# Patient Record
Sex: Female | Born: 1970 | Race: White | Hispanic: No | Marital: Married | Smoking: Never smoker
Health system: Southern US, Community
[De-identification: ages and names within clinical notes are randomized; demographics above are authoritative.]

## PROBLEM LIST (undated history)

## (undated) DIAGNOSIS — F32A Depression, unspecified: Secondary | ICD-10-CM

## (undated) DIAGNOSIS — F329 Major depressive disorder, single episode, unspecified: Secondary | ICD-10-CM

## (undated) DIAGNOSIS — F419 Anxiety disorder, unspecified: Secondary | ICD-10-CM

## (undated) DIAGNOSIS — G8929 Other chronic pain: Secondary | ICD-10-CM

## (undated) DIAGNOSIS — M25512 Pain in left shoulder: Principal | ICD-10-CM

## (undated) HISTORY — DX: Major depressive disorder, single episode, unspecified: F32.9

## (undated) HISTORY — DX: Depression, unspecified: F32.A

## (undated) HISTORY — DX: Anxiety disorder, unspecified: F41.9

## (undated) HISTORY — DX: Other chronic pain: G89.29

## (undated) HISTORY — DX: Pain in left shoulder: M25.512

---

## 2007-06-14 ENCOUNTER — Encounter: Admission: RE | Admit: 2007-06-14 | Discharge: 2007-06-14 | Payer: Self-pay | Admitting: Obstetrics and Gynecology

## 2007-09-28 ENCOUNTER — Ambulatory Visit (HOSPITAL_COMMUNITY): Admission: RE | Admit: 2007-09-28 | Discharge: 2007-09-28 | Payer: Self-pay | Admitting: Obstetrics and Gynecology

## 2008-05-31 ENCOUNTER — Inpatient Hospital Stay (HOSPITAL_COMMUNITY): Admission: AD | Admit: 2008-05-31 | Discharge: 2008-05-31 | Payer: Self-pay | Admitting: Obstetrics & Gynecology

## 2008-08-13 ENCOUNTER — Inpatient Hospital Stay (HOSPITAL_COMMUNITY): Admission: AD | Admit: 2008-08-13 | Discharge: 2008-08-13 | Payer: Self-pay | Admitting: Obstetrics & Gynecology

## 2008-08-25 ENCOUNTER — Inpatient Hospital Stay (HOSPITAL_COMMUNITY): Admission: AD | Admit: 2008-08-25 | Discharge: 2008-08-25 | Payer: Self-pay | Admitting: Obstetrics and Gynecology

## 2008-09-11 ENCOUNTER — Inpatient Hospital Stay (HOSPITAL_COMMUNITY): Admission: RE | Admit: 2008-09-11 | Discharge: 2008-09-14 | Payer: Self-pay | Admitting: Obstetrics and Gynecology

## 2008-09-16 ENCOUNTER — Inpatient Hospital Stay (HOSPITAL_COMMUNITY): Admission: AD | Admit: 2008-09-16 | Discharge: 2008-09-16 | Payer: Self-pay | Admitting: Obstetrics and Gynecology

## 2010-09-18 LAB — CBC
Hemoglobin: 10.4 g/dL — ABNORMAL LOW (ref 12.0–15.0)
Hemoglobin: 13 g/dL (ref 12.0–15.0)
MCHC: 34.5 g/dL (ref 30.0–36.0)
MCHC: 34.8 g/dL (ref 30.0–36.0)
RBC: 3.14 MIL/uL — ABNORMAL LOW (ref 3.87–5.11)
RDW: 13.6 % (ref 11.5–15.5)
WBC: 6.6 10*3/uL (ref 4.0–10.5)

## 2010-09-18 LAB — RPR: RPR Ser Ql: NONREACTIVE

## 2010-10-22 NOTE — H&P (Signed)
Shari Peters, AFONSO               ACCOUNT NO.:  [id number]   MEDICAL RECORD NO.:  [id number]          PATIENT TYPE:  INP   LOCATION:                                FACILITY:  [facility]   PHYSICIAN:  Freddy Finner, M.D.   DATE OF BIRTH:  Nov 20, 1970   DATE OF ADMISSION:  09/11/2008  DATE OF DISCHARGE:                              HISTORY & PHYSICAL   ADMITTING DIAGNOSES:  Intrauterine pregnancy at term, surgically scarred  uterus, for repeat cesarean delivery.   The patient is a 40 year old white married female gravida 2, para 1 who  had a cesarean section with her first delivery for cephalopelvic  disproportion.  Her prenatal course has been remarkable for early  ultrasound finding of previa but this resolved.  She does have a history  of congestive heart failure with previous pregnancy, which is somewhat  questionable and not documented in our record.  She has had a cardiology  consultation prior to this pregnancy which was normal.  This pregnancy  has been uncomplicated except as noted above and she is now admitted at  39-1/7 weeks' gestation for repeat cesarean delivery.   REVIEW OF SYSTEMS:  Negative.  No cardiopulmonary, GI, or GU complaints  at the present time.   PAST MEDICAL HISTORY:  Recorded in detail in the prenatal summary, will  not be repeated here.   FAMILY HISTORY:  Noncontributory.   PHYSICAL EXAMINATION:  HEENT:  Grossly normal, thyroid gland is not  palpably enlarged.  Blood pressure in the office at her last visit was  118/70.  CHEST:  Clear to auscultation.  HEART:  Normal sinus rhythm, no significant murmur, no rebound, no  guarding.  ABDOMEN:  Gravid.  Estimated fetal weight of 8 pounds.  EXTREMITIES:  +1 edema without cyanosis or clubbing.  PELVIC EXAM:  Deferred.   ASSESSMENT:  Intrauterine pregnancy at term, surgically scarred uterus.   PLAN:  Repeat cesarean delivery.      Freddy Finner, M.D.  Electronically Signed     WRN/MEDQ  D:   09/09/2008  T:  09/09/2008  Job:  [id number]

## 2010-10-22 NOTE — Op Note (Signed)
NAMEPALAK, Shari Peters             ACCOUNT NO.:  [id number]   MEDICAL RECORD NO.:  [id number]          PATIENT TYPE:  INP   LOCATION:  9122                          FACILITY:  [facility]   PHYSICIAN:  Freddy Finner, M.D.   DATE OF BIRTH:  1970-12-08   DATE OF PROCEDURE:  DATE OF DISCHARGE:  08/25/2008                               OPERATIVE REPORT   PREOPERATIVE DIAGNOSIS:  Intrauterine pregnancy 39-1/[redacted] weeks gestation,  surgically scarred uterus.   POSTOPERATIVE DIAGNOSIS:  Intrauterine pregnancy 39-1/[redacted] weeks gestation,  surgically scarred uterus.   OPERATIVE PROCEDURE:  Repeat low transverse cervical cesarean section  with midline extension of uterine incision and delivery of viable female  infant, Apgars of 9 at 1 minute and 9 at 5 minute.  Arterial cord pH of  7.34.   INTRAOPERATIVE COMPLICATIONS:  None.   ESTIMATED INTRAOPERATIVE BLOOD LOSS:  800 cc.   The patient is a 40 year old gravida 3, para 1 by cesarean for CPD who  is now admitted at term for repeat cesarean delivery.  She was admitted  on the morning of surgery.  She was given cefotetan IV preoperatively.  She was brought to the operating room, placed under adequate spinal  anesthesia, placed in the dorsal recumbent position with elevation of  the right hip by 15 degrees.  The abdomen was prepped and draped in the  usual fashion and Foley catheter placed using sterile technique.   A lower abdominal transverse incision was made just above the old lower  abdominal transverse scar.  This was carried sharply down to fascia.  Fascia was entered sharply and extended to the extent of the skin  incision.  Rectus sheath was developed superiorly and inferiorly with  blunt and sharp dissection.  Rectus muscles divided in the midline.  Peritoneum was elevated, entered sharply and extended bluntly and  sharply to the extent of the skin incision.  Bladder blade was placed.  Transverse incision was made in the visceral peritoneum  overlying the  lower uterine segment and the bladder pushed off the lower segment.  A  transverse incision was made in the uterus and extended transversely  with blunt dissection to the extent of the skin incision.  The amniotic  membrane was ruptured.  Fluid was clear.  Initial attempts to deliver  the infant were complicated by scarring by the floating position of the  infant on start of the procedure.  A Kiwi device was applied, and the  incision was extended in the midline for a distance of approximately 3  cm.  This allowed easy delivery of a viable female infant.  Statistics are  noted above.  The placenta and products of conception were removed.  This was confirmed complete by manual exploration of the uterus.  The  uterus was delivered onto the anterior abdominal wall.  Careful manual  exploration confirmed complete evacuation of products of conception.  The uterine incision was closed with a single layer for the T portion of  the incision with running locking 0 Monocryl.  The transverse portion  was a double layered closure with running locking 0 Monocryl for  the  first layer and imbricating suture of 0 Monocryl for the second layer.  The uterus was delivered back into the abdominal cavity.  The uterus  itself was normal as were the tubes and ovaries.  Irrigation was carried  out.  Hemostasis was complete.  All pack, needle and instrument counts  were correct.  The abdominal incision was closed in layers.  A running 0  Monocryl was used to close the peritoneum and reapproximate the rectus  muscles.  The fascia was closed with looped 0 PDS running from angle to  angle.  The subcu was approximated with a running 2-0 plain.  The skin  was closed with skin staples.  The patient was taken to the recovery  room in good condition.      Freddy Finner, M.D.  Electronically Signed     WRN/MEDQ  D:  09/11/2008  T:  09/11/2008  Job:  [id number]

## 2010-10-25 NOTE — Discharge Summary (Signed)
Shari Peters, HEATHCOCK             ACCOUNT NO.:  [id number]   MEDICAL RECORD NO.:  [id number]          PATIENT TYPE:  INP   LOCATION:  9122                          FACILITY:  [facility]   PHYSICIAN:  Freddy Finner, M.D.   DATE OF BIRTH:  1970/11/24   DATE OF ADMISSION:  09/11/2008  DATE OF DISCHARGE:  09/14/2008                               DISCHARGE SUMMARY   ADMITTING DIAGNOSES:  1. Intrauterine pregnancy at term.  2. Previous cesarean section, desires repeat.   DISCHARGE DIAGNOSIS:  Status post low transverse cesarean section to a  viable female infant.   PROCEDURE:  Repeat low transverse cesarean section.   REASON FOR ADMISSION:  Please see written H and P.   HOSPITAL COURSE:  The patient 40 year old gravida 2, para 1 who was  admitted to [facility] for scheduled cesarean section.  The patient had had a previous cesarean delivery and desired repeat.  On  the morning of admission, the patient was taken to operating room where  spinal anesthesia was administered without difficulty.  A low transverse  incision was made with delivery of a viable female infant weighing 6  pounds 8 ounces with Apgars of 9 at 1 minute and 9 at 5 minutes.  The  patient tolerated procedure well and taken to the recovery room in  stable condition.  On postoperative day 1, the patient was without  complaint.  Vital signs remained stable.  She was afebrile.  Abdomen  soft with good return of bowel function.  Fundus is firm and nontender.  Abdominal dressing is noted to be clean, dry and intact.  Foley had been  discontinued and she was voiding well.  Laboratory findings showed  hemoglobin of 10.4, platelet count 159,000, WBC count of 6.6.  On  postoperative day 2, the patient was without complaint.  Vital signs  remained stable.  She is afebrile.  Abdominal dressing had been removed  revealing incision that is clean, dry and intact.  The patient was  ambulating well.  On postoperative day 3,  the patient was without  complaint.  Vital signs remained stable.  She is afebrile.  Fundus firm  and nontender.  Incision was clean, dry and intact.  Staples removed and  the patient was later discharged home.   CONDITION ON DISCHARGE:  Stable.   DIET:  Regular as tolerated.   ACTIVITY:  No heavy lifting, no driving x2 weeks, no vaginal entry.   FOLLOW UP:  The patient to follow up in the office in 1-2 weeks for  incision check.  She is to call for temperature greater than 100  degrees, persistent nausea, vomiting, heavy vaginal bleeding and/or  redness or drainage from incisional site.   DISCHARGE MEDICATIONS:  1. Darvocet-N 100 #30 one p.o. 4-6 hours p.r.n.  2. Motrin 600 mg every 6 hours.  3. Prenatal vitamins one p.o. daily.  4. Colace 1 p.o. daily p.r.n.      Julio Sicks, N.P.      Freddy Finner, M.D.  Electronically Signed    CC/MEDQ  D:  09/28/2008  T:  09/28/2008  Job:  [id number]

## 2014-12-19 ENCOUNTER — Ambulatory Visit (INDEPENDENT_AMBULATORY_CARE_PROVIDER_SITE_OTHER): Payer: 59 | Admitting: Licensed Clinical Social Worker

## 2014-12-19 DIAGNOSIS — F322 Major depressive disorder, single episode, severe without psychotic features: Secondary | ICD-10-CM | POA: Diagnosis not present

## 2014-12-19 DIAGNOSIS — F411 Generalized anxiety disorder: Secondary | ICD-10-CM

## 2014-12-26 ENCOUNTER — Ambulatory Visit (INDEPENDENT_AMBULATORY_CARE_PROVIDER_SITE_OTHER): Payer: 59 | Admitting: Licensed Clinical Social Worker

## 2014-12-26 DIAGNOSIS — F411 Generalized anxiety disorder: Secondary | ICD-10-CM

## 2014-12-26 DIAGNOSIS — F322 Major depressive disorder, single episode, severe without psychotic features: Secondary | ICD-10-CM | POA: Diagnosis not present

## 2015-01-02 ENCOUNTER — Ambulatory Visit: Payer: 59 | Admitting: Licensed Clinical Social Worker

## 2015-01-10 ENCOUNTER — Ambulatory Visit (INDEPENDENT_AMBULATORY_CARE_PROVIDER_SITE_OTHER): Payer: 59 | Admitting: Licensed Clinical Social Worker

## 2015-01-10 DIAGNOSIS — F332 Major depressive disorder, recurrent severe without psychotic features: Secondary | ICD-10-CM | POA: Diagnosis not present

## 2015-01-10 DIAGNOSIS — F411 Generalized anxiety disorder: Secondary | ICD-10-CM | POA: Diagnosis not present

## 2015-01-22 ENCOUNTER — Ambulatory Visit (INDEPENDENT_AMBULATORY_CARE_PROVIDER_SITE_OTHER): Payer: 59 | Admitting: Licensed Clinical Social Worker

## 2015-01-22 DIAGNOSIS — F411 Generalized anxiety disorder: Secondary | ICD-10-CM | POA: Diagnosis not present

## 2015-01-22 DIAGNOSIS — F332 Major depressive disorder, recurrent severe without psychotic features: Secondary | ICD-10-CM | POA: Diagnosis not present

## 2015-02-05 ENCOUNTER — Ambulatory Visit (INDEPENDENT_AMBULATORY_CARE_PROVIDER_SITE_OTHER): Payer: 59 | Admitting: Licensed Clinical Social Worker

## 2015-02-05 DIAGNOSIS — F411 Generalized anxiety disorder: Secondary | ICD-10-CM | POA: Diagnosis not present

## 2015-02-05 DIAGNOSIS — F332 Major depressive disorder, recurrent severe without psychotic features: Secondary | ICD-10-CM

## 2015-02-13 ENCOUNTER — Ambulatory Visit (INDEPENDENT_AMBULATORY_CARE_PROVIDER_SITE_OTHER): Payer: 59 | Admitting: Licensed Clinical Social Worker

## 2015-02-13 DIAGNOSIS — F332 Major depressive disorder, recurrent severe without psychotic features: Secondary | ICD-10-CM | POA: Diagnosis not present

## 2015-02-13 DIAGNOSIS — F411 Generalized anxiety disorder: Secondary | ICD-10-CM | POA: Diagnosis not present

## 2015-02-27 ENCOUNTER — Ambulatory Visit (INDEPENDENT_AMBULATORY_CARE_PROVIDER_SITE_OTHER): Payer: 59 | Admitting: Licensed Clinical Social Worker

## 2015-02-27 DIAGNOSIS — F332 Major depressive disorder, recurrent severe without psychotic features: Secondary | ICD-10-CM

## 2015-02-27 DIAGNOSIS — F411 Generalized anxiety disorder: Secondary | ICD-10-CM | POA: Diagnosis not present

## 2015-03-06 ENCOUNTER — Ambulatory Visit: Payer: 59 | Admitting: Licensed Clinical Social Worker

## 2015-03-07 ENCOUNTER — Ambulatory Visit: Payer: 59 | Admitting: Licensed Clinical Social Worker

## 2015-03-20 ENCOUNTER — Ambulatory Visit (INDEPENDENT_AMBULATORY_CARE_PROVIDER_SITE_OTHER): Payer: 59 | Admitting: Licensed Clinical Social Worker

## 2015-03-20 DIAGNOSIS — F332 Major depressive disorder, recurrent severe without psychotic features: Secondary | ICD-10-CM

## 2015-03-20 DIAGNOSIS — F411 Generalized anxiety disorder: Secondary | ICD-10-CM

## 2015-04-03 ENCOUNTER — Ambulatory Visit: Payer: 59 | Admitting: Licensed Clinical Social Worker

## 2015-10-24 DIAGNOSIS — J3089 Other allergic rhinitis: Secondary | ICD-10-CM | POA: Insufficient documentation

## 2015-10-24 DIAGNOSIS — F331 Major depressive disorder, recurrent, moderate: Secondary | ICD-10-CM | POA: Insufficient documentation

## 2016-04-24 ENCOUNTER — Other Ambulatory Visit: Payer: Self-pay | Admitting: Physical Medicine and Rehabilitation

## 2016-04-24 DIAGNOSIS — M25512 Pain in left shoulder: Secondary | ICD-10-CM

## 2016-04-24 DIAGNOSIS — G894 Chronic pain syndrome: Secondary | ICD-10-CM

## 2016-04-24 DIAGNOSIS — M24212 Disorder of ligament, left shoulder: Secondary | ICD-10-CM

## 2016-04-24 DIAGNOSIS — M75102 Unspecified rotator cuff tear or rupture of left shoulder, not specified as traumatic: Secondary | ICD-10-CM

## 2016-05-06 ENCOUNTER — Ambulatory Visit
Admission: RE | Admit: 2016-05-06 | Discharge: 2016-05-06 | Disposition: A | Discharge status: Home / Self Care | Payer: 59 | Source: Ambulatory Visit | Attending: Physical Medicine and Rehabilitation | Admitting: Physical Medicine and Rehabilitation

## 2016-05-06 DIAGNOSIS — M75102 Unspecified rotator cuff tear or rupture of left shoulder, not specified as traumatic: Secondary | ICD-10-CM

## 2016-05-06 DIAGNOSIS — M24212 Disorder of ligament, left shoulder: Secondary | ICD-10-CM

## 2016-05-06 DIAGNOSIS — G894 Chronic pain syndrome: Secondary | ICD-10-CM

## 2016-05-06 DIAGNOSIS — M25512 Pain in left shoulder: Secondary | ICD-10-CM

## 2017-10-13 IMAGING — MR MR SHOULDER*L* W/O CM
4 of 5 series · 14 of 40 positions shown · non-contrast
Comparison: None.

CLINICAL DATA: One year history of shoulder pain. Initial injury
rowing.

EXAM:
MRI OF THE LEFT SHOULDER WITHOUT CONTRAST
TECHNIQUE: Multiplanar, multisequence MR imaging of the shoulder was performed.
No intravenous contrast was administered.

[Series 7: T2 fat-sat · coronal · left · 3.0mm · 0.44mm/px · 3 of 21 slices shown (1 of 3)]
[im 4/21]
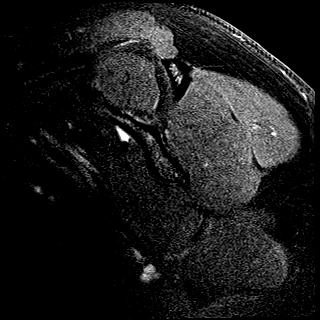
[im 11/21]
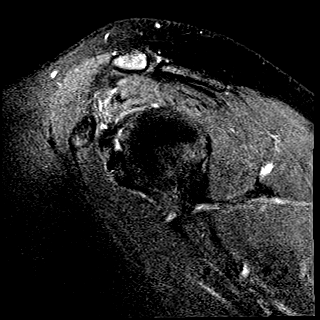
[im 17/21]
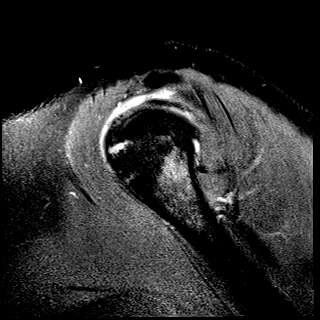

[Series 10: T2 fat-sat · sagittal · left · 3.0mm · 0.22mm/px · 3 of 21 slices shown (2 of 3)]
[im 3/21]
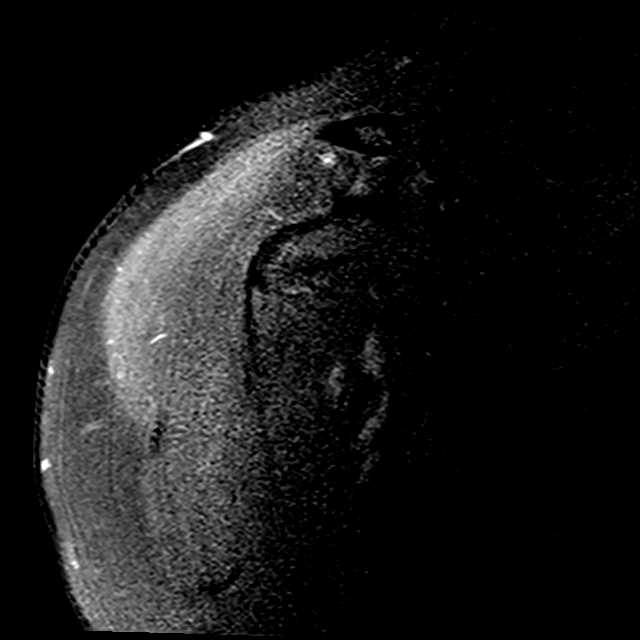
[im 12/21]
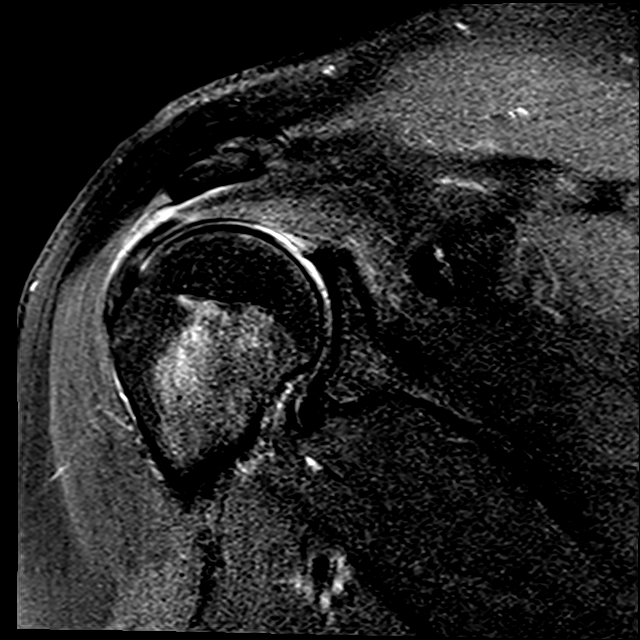
[im 18/21]
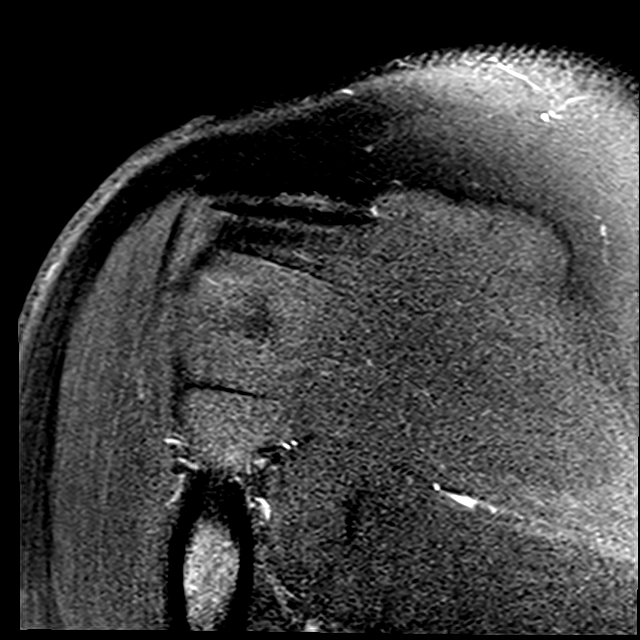

[Series 11: PD · sagittal · left · 3.0mm · 0.18mm/px · 5 of 21 slices shown]
[im 1/21]
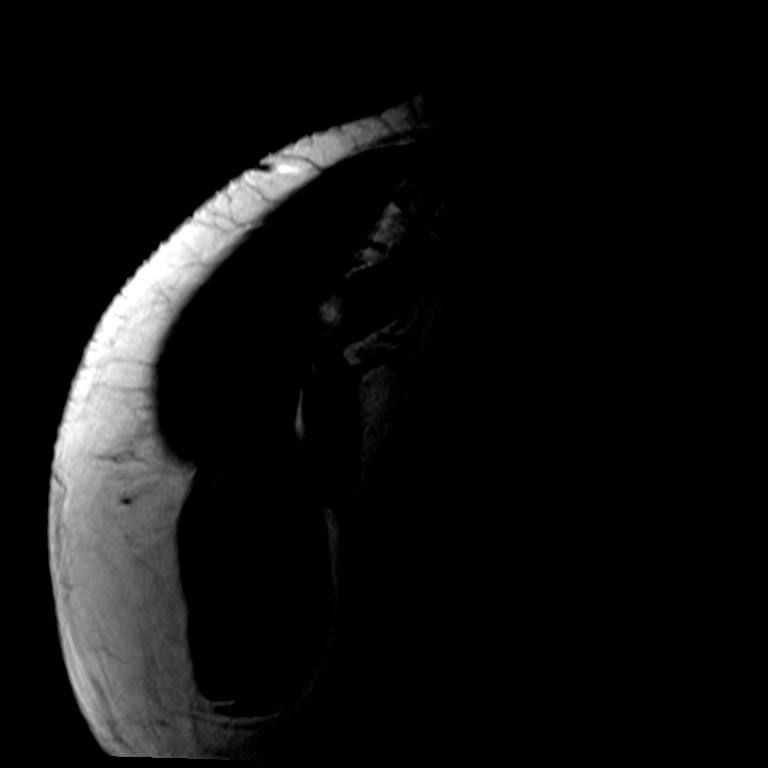
[im 3/21]
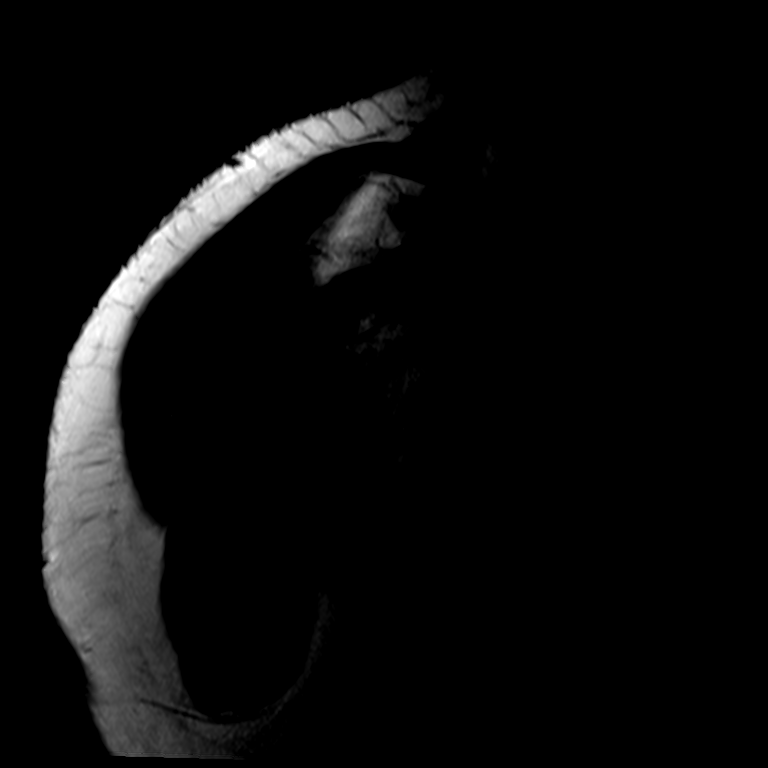
[im 6/21]
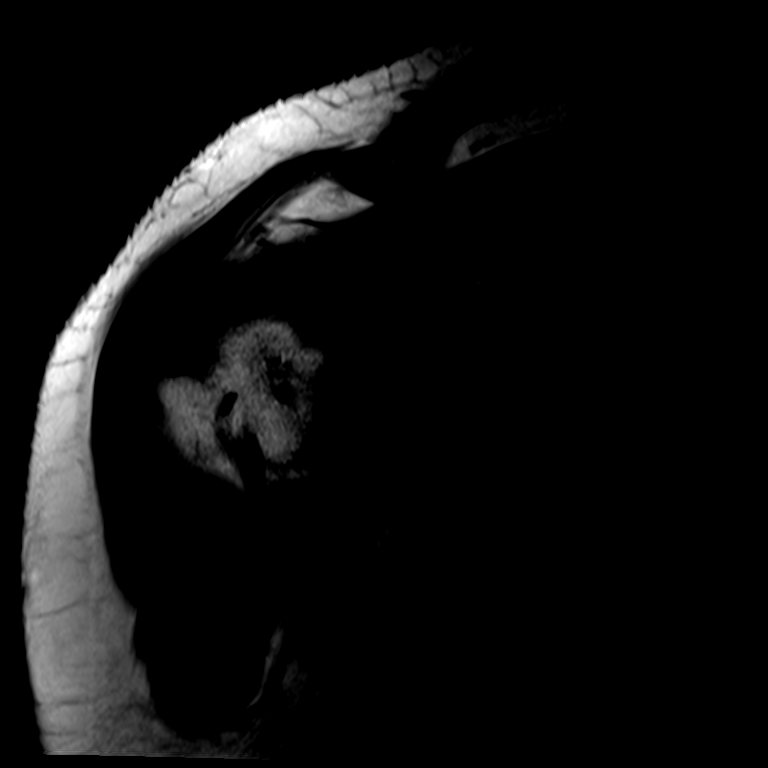
[im 12/21]
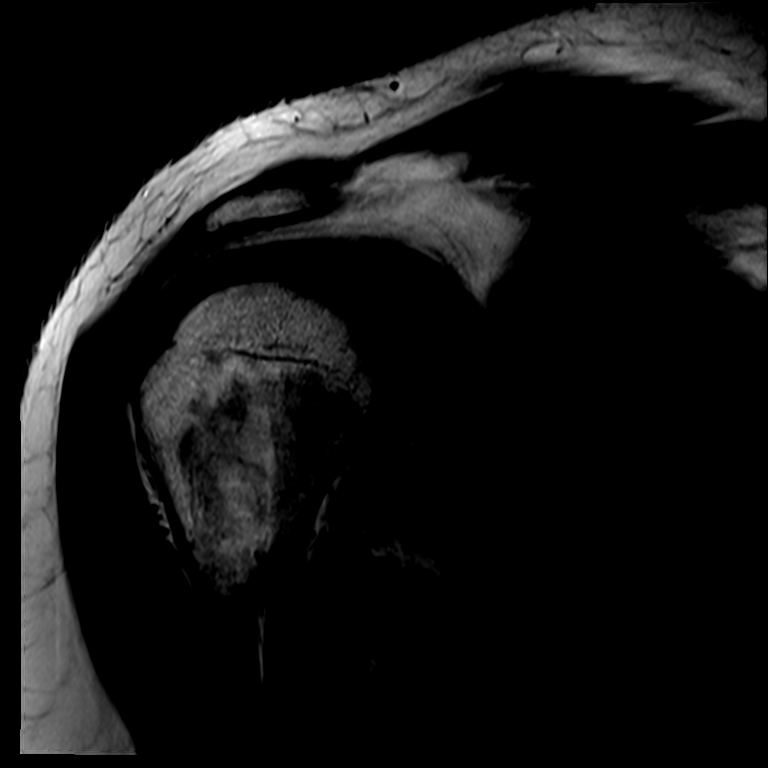
[im 18/21]
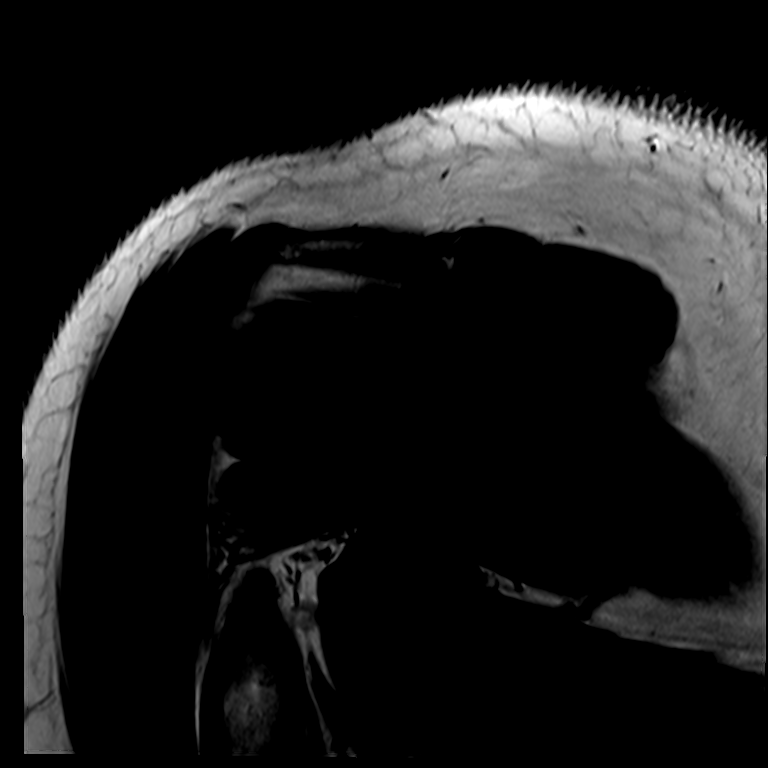

[Series 12: T2 fat-sat · axial · left · 3.0mm · 0.44mm/px · z∈[-52,+15]mm · 3 of 25 slices shown (3 of 3)]
[im 4/25]
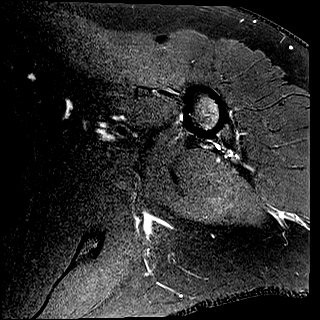
[im 13/25]
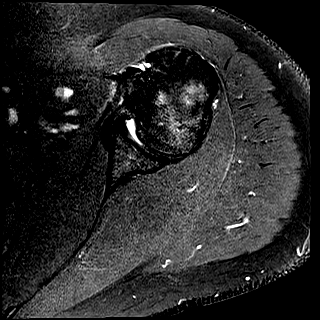
[im 22/25]
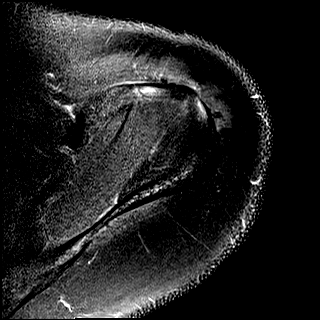

[14 of 40 positions shown; findings below may reference images not displayed]

FINDINGS: Rotator cuff: Mild to moderate rotator cuff tendinopathy/
tendinosis. There are areas of bursal and articular surface
irregularity likely reflecting fraying and fibrillation. No discrete
partial or full-thickness rotator cuff tear.

Muscles:  Normal.

Biceps long head:  Intact.

Acromioclavicular Joint: No significant degenerative changes. Type 2
acromion. Mild lateral downsloping but no undersurface spurring.

Glenohumeral Joint: Mild/early degenerative changes with small joint
effusion and synovitis versus adhesive capsulitis.

Labrum:  No definite labral tears.

Bones: No acute bony findings. Mild reactive edema noted in the
distal clavicle.

Other: Mild subacromial/subdeltoid bursitis.
IMPRESSION: 1. Mild to moderate rotator cuff tendinopathy/tendinosis. Bursal and
articular surface fraying and fibrillation but no partial or
full-thickness tear.
2. Intact long head biceps tendon and glenoid labrum.
3. Mild lateral downsloping of a type 2 acromion but no undersurface
spurring.
4. Mild/early glenohumeral joint degenerative changes with small
joint effusion and synovitis versus adhesive capsulitis.

## 2017-10-22 ENCOUNTER — Ambulatory Visit (INDEPENDENT_AMBULATORY_CARE_PROVIDER_SITE_OTHER): Payer: Managed Care, Other (non HMO) | Admitting: Physical Medicine and Rehabilitation

## 2017-10-22 ENCOUNTER — Encounter

## 2017-10-22 ENCOUNTER — Encounter (INDEPENDENT_AMBULATORY_CARE_PROVIDER_SITE_OTHER): Payer: Self-pay | Admitting: Physical Medicine and Rehabilitation

## 2017-10-22 VITALS — BP 130/85 | HR 82 | Temp 98.4°F | Ht 62.0 in | Wt 168.0 lb

## 2017-10-22 DIAGNOSIS — M67912 Unspecified disorder of synovium and tendon, left shoulder: Secondary | ICD-10-CM | POA: Diagnosis not present

## 2017-10-22 DIAGNOSIS — M25512 Pain in left shoulder: Secondary | ICD-10-CM

## 2017-10-22 DIAGNOSIS — G8929 Other chronic pain: Secondary | ICD-10-CM

## 2017-10-22 NOTE — Progress Notes (Signed)
 .  Numeric Pain Rating Scale and Functional Assessment Average Pain 7 Pain Right Now 3 My pain is intermittent Pain is worse with: walking, bending and some activites Pain improves with: rest   In the last MONTH (on 0-10 scale) has pain interfered with the following?  1. General activity like being  able to carry out your everyday physical activities such as walking, climbing stairs, carrying groceries, or moving a chair?  Rating(5)  2. Relation with others like being able to carry out your usual social activities and roles such as  activities at home, at work and in your community. Rating(6)  3. Enjoyment of life such that you have  been bothered by emotional problems such as feeling anxious, depressed or irritable?  Rating(3)

## 2017-10-23 ENCOUNTER — Other Ambulatory Visit (INDEPENDENT_AMBULATORY_CARE_PROVIDER_SITE_OTHER): Payer: Self-pay | Admitting: Physical Medicine and Rehabilitation

## 2017-10-23 DIAGNOSIS — M25512 Pain in left shoulder: Principal | ICD-10-CM

## 2017-10-23 DIAGNOSIS — G8929 Other chronic pain: Secondary | ICD-10-CM

## 2017-10-23 NOTE — Progress Notes (Signed)
amb ref °

## 2017-10-29 ENCOUNTER — Ambulatory Visit (INDEPENDENT_AMBULATORY_CARE_PROVIDER_SITE_OTHER): Payer: Self-pay | Admitting: Physical Medicine and Rehabilitation

## 2018-02-24 ENCOUNTER — Encounter (INDEPENDENT_AMBULATORY_CARE_PROVIDER_SITE_OTHER): Payer: Self-pay | Admitting: Physical Medicine and Rehabilitation

## 2018-02-24 NOTE — Progress Notes (Signed)
Shari Peters - 47 y.o. female MRN [id number]  Date of birth: [birth year]  Office Visit Note: Visit Date: 10/22/2017 PCP: No primary care provider on file. Referred by: No ref. provider found  Subjective: Chief Complaint  Patient presents with  . Left Shoulder - Pain   HPI: Shari Peters is a very pleasant 47 year old right-hand-dominant female who comes in today as a self-referral and essentially referral through Dr.Shawn Dalton-Bethea.  Patient was seeing Dr. Nickola Major but was having difficulty because she did not take her insurance.  She reports pain in the left shoulder that radiates laterally and into the anterior pectoral area.  She reports that this started in 2017 without specific injury although she felt like it may have come from working out and lifting weights.  She reports not lifting weights does make the pain better but she does like to stay physically active.  She reports really any activity makes it worse.  She does not note any right-sided shoulder complaints.  She has had no specific neck pain or pain down the arms in a radicular fashion.  No paresthesias.  She gets occasional neck pain but does not relate the 2.  She has not noted any weakness.  She has had no associated headaches or blurry vision.  She has had no other red flag complaints.  She has had a MRI of the left shoulder and this is reviewed with her today.  This was completed in 2017.  Patient has had treatment with anti-inflammatories and pain medication and injection.  Her case is somewhat complicated by history of depression and anxiety although she is been doing well with that.  She does carry a lot of tension in her shoulders and does carry a lot of that stress into her neck muscles and she does feel like a lot of her pain is stress related.  She has not had formal physical therapy with dry needling.   Review of Systems  Constitutional: Negative for chills, fever, malaise/fatigue and weight loss.  HENT:  Negative for hearing loss and sinus pain.   Eyes: Negative for blurred vision, double vision and photophobia.  Respiratory: Negative for cough and shortness of breath.   Cardiovascular: Negative for chest pain, palpitations and leg swelling.  Gastrointestinal: Negative for abdominal pain, nausea and vomiting.  Genitourinary: Negative for flank pain.  Musculoskeletal: Positive for joint pain. Negative for myalgias.  Skin: Negative for itching and rash.  Neurological: Negative for tingling, tremors, sensory change, focal weakness and weakness.  Endo/Heme/Allergies: Negative.   Psychiatric/Behavioral: Negative for depression.  All other systems reviewed and are negative.  Otherwise per HPI.  Assessment & Plan: Visit Diagnoses:  1. Chronic left shoulder pain   2. Rotator cuff disorder, left     Plan: Findings:  Chronic worsening severe but intermittent 7 out of 10 pain at times of the left shoulder which seems to be a combination of myofascial pain and some mild impingement.  I cannot rule out possible radicular pain although I think this less likely.  MRI of the shoulder which was completed in 2017 did show moderate tendinosis of the rotator cuff as well as early degenerative joint changes and what appeared to be may be synovitis from adhesive capsulitis.  Injection at the time did seem to help.  Exam today does not show as much impingement but does show myofascial trigger points.  I think the biggest step right now would be to get her in a good physical therapy program at [facility]  physical therapy looking at dry needling and shoulder manual treatment.  Depending on relief would look at intra-articular injection using fluoroscopic guidance and possible referral to Dr. Burnard BuntingG. Scott Dean in our office for evaluation management from orthopedic standpoint.             Meds & Orders: No orders of the defined types were placed in this encounter.  No orders of the defined types were placed in  this encounter.   Follow-up: Return if symptoms worsen or fail to improve.   Procedures: No procedures performed  No notes on file   Clinical History: MRI OF THE LEFT SHOULDER WITHOUT CONTRAST  TECHNIQUE: Multiplanar, multisequence MR imaging of the shoulder was performed. No intravenous contrast was administered.  COMPARISON:  None.  FINDINGS: Rotator cuff: Mild to moderate rotator cuff tendinopathy/ tendinosis. There are areas of bursal and articular surface irregularity likely reflecting fraying and fibrillation. No discrete partial or full-thickness rotator cuff tear.  Muscles:  Normal.  Biceps long head:  Intact.  Acromioclavicular Joint: No significant degenerative changes. Type 2 acromion. Mild lateral downsloping but no undersurface spurring.  Glenohumeral Joint: Mild/early degenerative changes with small joint effusion and synovitis versus adhesive capsulitis.  Labrum:  No definite labral tears.  Bones: No acute bony findings. Mild reactive edema noted in the distal clavicle.  Other: Mild subacromial/subdeltoid bursitis.  IMPRESSION: 1. Mild to moderate rotator cuff tendinopathy/tendinosis. Bursal and articular surface fraying and fibrillation but no partial or full-thickness tear. 2. Intact long head biceps tendon and glenoid labrum. 3. Mild lateral downsloping of a type 2 acromion but no undersurface spurring. 4. Mild/early glenohumeral joint degenerative changes with small joint effusion and synovitis versus adhesive capsulitis.   Electronically Signed   By: Rudie MeyerP.  Gallerani M.D.   On: 05/06/2016 14:32   She reports that she has never smoked. She has never used smokeless tobacco. No results for input(s): HGBA1C, LABURIC in the last 8760 hours.  Objective:  VS:  HT:5\' 2"  (157.5 cm)   WT:168 lb (76.2 kg)  BMI:30.72    BP:130/85  HR:82bpm  TEMP:98.4 F (36.9 C)(Oral)  RESP:99 % Physical Exam  Constitutional: She is oriented to  person, place, and time. She appears well-developed and well-nourished. No distress.  HENT:  Head: Normocephalic and atraumatic.  Nose: Nose normal.  Mouth/Throat: Oropharynx is clear and moist.  Eyes: Pupils are equal, round, and reactive to light. Conjunctivae are normal.  Neck: Normal range of motion. Neck supple.  Cardiovascular: Regular rhythm and intact distal pulses.  Pulmonary/Chest: Effort normal. No respiratory distress.  Abdominal: Soft. She exhibits no distension. There is no guarding.  Musculoskeletal:  Examination of the cervical spine shows slightly forward flexed cervical spine with some lack of range of motion and ranges of right rotation versus left.  Negative Spurling's test.  Myofascial trigger points which reproduce a lot of her pain in the trapezius and levator scapula and supraspinatus.  She has some impingement signs of the extreme range of motion with external rotation on the right compared to left.  Negative drop arm test.  She has good strength bilaterally.  She has a negative Hoffman's test bilaterally.  Neurological: She is alert and oriented to person, place, and time. She exhibits normal muscle tone. Coordination normal.  Skin: Skin is warm. No rash noted. No erythema.  Psychiatric: She has a normal mood and affect. Her behavior is normal.  Nursing note and vitals reviewed.   Ortho Exam Imaging: No results found.  Past Medical/Family/Surgical/Social History:  Medications & Allergies reviewed per EMR, new medications updated. Patient Active Problem List   Diagnosis Date Noted  . Environmental and seasonal allergies 10/24/2015  . Moderate episode of recurrent major depressive disorder (HCC) 10/24/2015   Past Medical History:  Diagnosis Date  . Anxiety   . Chronic left shoulder pain   . Depression    History reviewed. No pertinent family history. History reviewed. No pertinent surgical history. Social History   Occupational History  . Not on file    Tobacco Use  . Smoking status: Never Smoker  . Smokeless tobacco: Never Used  Substance and Sexual Activity  . Alcohol use: Yes    Alcohol/week: 1.0 standard drinks    Types: 1 Glasses of wine per week  . Drug use: Never  . Sexual activity: Not on file

## 2022-09-26 ENCOUNTER — Other Ambulatory Visit (HOSPITAL_BASED_OUTPATIENT_CLINIC_OR_DEPARTMENT_OTHER): Payer: Self-pay

## 2022-09-26 MED ORDER — ZEPBOUND 7.5 MG/0.5ML ~~LOC~~ SOAJ
7.5000 mg | SUBCUTANEOUS | 3 refills | Status: AC
  Filled 2022-09-26 (×2): qty 2, 28d supply, fill #0

## 2022-10-24 ENCOUNTER — Other Ambulatory Visit: Payer: Self-pay

## 2022-10-24 MED ORDER — ZEPBOUND 10 MG/0.5ML ~~LOC~~ SOAJ
10.0000 mg | SUBCUTANEOUS | 3 refills | Status: AC
  Filled 2022-10-24: qty 2, 28d supply, fill #0
  Filled 2022-12-31: qty 2, 28d supply, fill #1
  Filled 2023-02-13: qty 2, 28d supply, fill #2

## 2022-10-30 ENCOUNTER — Other Ambulatory Visit: Payer: Self-pay

## 2022-12-31 ENCOUNTER — Other Ambulatory Visit: Payer: Self-pay

## 2022-12-31 ENCOUNTER — Other Ambulatory Visit (HOSPITAL_COMMUNITY): Payer: Self-pay

## 2023-02-13 ENCOUNTER — Other Ambulatory Visit: Payer: Self-pay

## 2023-02-20 ENCOUNTER — Other Ambulatory Visit: Payer: Self-pay

## 2023-04-20 ENCOUNTER — Encounter: Payer: Self-pay | Admitting: Physical Therapy

## 2023-04-20 ENCOUNTER — Ambulatory Visit: Payer: Managed Care, Other (non HMO) | Attending: Physician Assistant | Admitting: Physical Therapy

## 2023-04-20 ENCOUNTER — Other Ambulatory Visit: Payer: Self-pay

## 2023-04-20 DIAGNOSIS — M542 Cervicalgia: Secondary | ICD-10-CM | POA: Diagnosis not present

## 2023-04-20 DIAGNOSIS — M5459 Other low back pain: Secondary | ICD-10-CM | POA: Insufficient documentation

## 2023-04-20 DIAGNOSIS — M62838 Other muscle spasm: Secondary | ICD-10-CM | POA: Diagnosis present

## 2023-04-20 DIAGNOSIS — R293 Abnormal posture: Secondary | ICD-10-CM | POA: Insufficient documentation

## 2023-04-20 DIAGNOSIS — R262 Difficulty in walking, not elsewhere classified: Secondary | ICD-10-CM | POA: Diagnosis not present

## 2023-04-20 DIAGNOSIS — M6281 Muscle weakness (generalized): Secondary | ICD-10-CM | POA: Insufficient documentation

## 2023-04-20 NOTE — Therapy (Signed)
OUTPATIENT PHYSICAL THERAPY THORACOLUMBAR / CERVICAL EVALUATION   Patient Name: Shari Peters MRN: [id number] DOB:1970/07/07, 52 y.o., female Today's Date: 04/20/2023  END OF SESSION:  PT End of Session - 04/20/23 1927     Visit Number 1    Date for PT Re-Evaluation 06/14/22    Authorization Type [employer]    Progress Note Due on Visit 10    PT Start Time 1450    PT Stop Time 1534    PT Time Calculation (min) 44 min    Activity Tolerance Patient tolerated treatment well    Behavior During Therapy WFL for tasks assessed/performed             Past Medical History:  Diagnosis Date   Anxiety    Chronic left shoulder pain    Depression    History reviewed. No pertinent surgical history. Patient Active Problem List   Diagnosis Date Noted   Environmental and seasonal allergies 10/24/2015   Moderate episode of recurrent major depressive disorder (HCC) 10/24/2015    PCP: None  REFERRING PROVIDER: Julien Girt, PA-C  REFERRING DIAG: M54.50 (ICD-10-CM) - Low back pain, unspecified M54.12 (ICD-10-CM) - Radiculopathy, cervical region  Rationale for Evaluation and Treatment: Rehabilitation  THERAPY DIAG:  Cervicalgia  Other low back pain  Other muscle spasm  Abnormal posture  ONSET DATE: 6-7 months ago  SUBJECTIVE:                                                                                                                                                                                           SUBJECTIVE STATEMENT: Patient did burn bootcamp for 3 years and she feels she did damage to her body because she did not have the correct form. She normally gets massages and she has had neck pain for years. She feels she has a "knot" in her upper neck. She feels she holds all her tension in her upper taps. Her entire Lt side from upper trap down her arm is painful. She experiences occasional numbness and tingling down Lt arm. Her back bothers her when she sits for a  long time. Pain is constant. Aggravating activities: reaching overhead; sleeping; turning to look in blind spots. Patient is Rt handed . She wants to get back to her workout regimen her pain has been interferring with that.  PERTINENT HISTORY:  Depression; anxiety; ADD  PAIN:  Are you having pain? Yes: NPRS scale: 6(currently) 8(worst)/10 Pain location: Bilateral lower C-spine but left is more painful currently Pain description: dull & achy Aggravating factors: sitting; eaching overhead; sleeping; turning to look in blind spots. Relieving factors: nothing  PRECAUTIONS: Other:  Patient has an unspecified lump on her Lt side under her arm. She is getting a diagnostic breast US and mammogram on Friday 11/15   RED FLAGS: None   WEIGHT BEARING RESTRICTIONS: No  FALLS:  Has patient fallen in last 6 months? No  LIVING ENVIRONMENT: Lives with: lives with their son Lives in: House/apartment Stairs: Yes but she rarely goes up them   OCCUPATION: Works from home ; works in Oncologist  PLOF: Independent  PATIENT GOALS: To not feel like I am falling a part  NEXT MD VISIT: PRN  OBJECTIVE:  Note: Objective measures were completed at Evaluation unless otherwise noted.  DIAGNOSTIC FINDINGS:  None  PATIENT SURVEYS:  NDI 20/50 40%  SCREENING FOR RED FLAGS: Bowel or bladder incontinence: No Spinal tumors: No Cauda equina syndrome: No Compression fracture: No Abdominal aneurysm: No  COGNITION: Overall cognitive status: Within functional limits for tasks assessed     SENSATION: Numbness and tingling ocassionaly in Lt arm  POSTURE: rounded shoulders and forward head  PALPATION: Increased muscle spasms of bilateral upper trap   Cervical ROM : AROM eval  Flexion 40 *pain coming up  Extension 36  Right lateral flexion 35  Left lateral flexion 40  Right rotation 60 *pain  Left rotation         60 *pain    LOWER EXTREMITY ROM:   WFL    FUNCTIONAL TESTS:  NT  GAIT:  Comments: Unremarkable  TODAY'S TREATMENT:                                                                                                                              DATE:   04/20/2023:  Initial Eval & HEP created Trigger Point Dry-Needling - Performed by Lorrene Reid  Treatment instructions: Expect mild to moderate muscle soreness. S/S of pneumothorax if dry needled over a lung field, and to seek immediate medical attention should they occur. Patient verbalized understanding of these instructions and education.  Patient Consent Given: Yes Education handout provided: Yes Muscles treated: Lt cervical multifidi; Lt upper trap; Lt rhomboids  Electrical stimulation performed: No Parameters: N/A Treatment response/outcome: Utilized skilled palpation to identify trigger points.  During dry needling able to palpate muscle twitch and muscle elongation  PATIENT EDUCATION:  Education details: DN handout; POC; HEP Person educated: Patient Education method: Programmer, multimedia, Demonstration, and Handouts Education comprehension: verbalized understanding, returned demonstration, and needs further education  HOME EXERCISE PROGRAM: Access Code: [email or web address] URL: [email or web address] Date: 04/20/2023 Prepared by: Claude Manges  Exercises - Seated Upper Trapezius Stretch  - 1-2 x daily - 7 x weekly - 2 sets - 20-30 hold - Seated Levator Scapulae Stretch  - 1-2 x daily - 7 x weekly - 2 sets - 20-30 hold - Seated Scapular Retraction  - 1 x daily - 7 x weekly - 2 sets - 3 hold  ASSESSMENT:  CLINICAL IMPRESSION: Patient is a 52 y.o. female who was seen today for physical therapy  evaluation and treatment for neck and back pain. Shari Peters has had chronic neck and back pain that has been exacerbated within the last 6-7 months. Her neck pain is worse than her back pain. She is constantly in pain and she has not been able to find any relief. Due to her neck pain she has not been consistent  at the gym and she is not able to make it through a work date without increased pain. Based on eval noted decreased cervical ROM, poor posture, and increased muscle spasms. Performed dry needling to help patient find some relief and palpable  muscle twitch and elongation were felt. Educated patient in HEP exercises and to stay hydrated and use a heating pad after treatment session. Patient will benefit from skilled PT to address the below impairments and improve overall function.   OBJECTIVE IMPAIRMENTS: decreased ROM, increased muscle spasms, postural dysfunction, and pain.   ACTIVITY LIMITATIONS: sitting, sleeping, and reach over head  PARTICIPATION LIMITATIONS: cleaning, driving, community activity, and occupation  PERSONAL FACTORS: Time since onset of injury/illness/exacerbation and 1-2 comorbidities: anxiety & depression  are also affecting patient's functional outcome.   REHAB POTENTIAL: Good  CLINICAL DECISION MAKING: Stable/uncomplicated  EVALUATION COMPLEXITY: Low   GOALS: Goals reviewed with patient? Yes  SHORT TERM GOALS: Target date: 05/18/2023  Patient will be independent with initial HEP. Baseline:  Goal status: INITIAL  2.  Patient will report > or = to 30% improvement in symptoms since starting PT. Baseline:  Goal status: INITIAL  3.  Patient will report 30% improvement in sleep quality.  Baseline:  Goal status: INITIAL  LONG TERM GOALS: Target date: 06/15/2023  Patient will demonstrate independence in advanced HEP. Baseline:  Goal status: INITIAL  2.  Patient will report > or = to 70% improvement in symptoms since starting PT. Baseline:  Goal status: INITIAL   3.  Patient will report > or = to 70% in sleep quality.  Baseline:  Goal status: INITIAL  4.  Patient will be able to reach over head with < or = to 2 / 10 pain. Baseline:  Goal status: INITIAL  5.  Patient will be able to begin workout regimen  Baseline:  Goal status:  INITIAL    PLAN:  PT FREQUENCY: 2x/week  PT DURATION: 8 weeks  PLANNED INTERVENTIONS: 97164- PT Re-evaluation, 97110-Therapeutic exercises, 97530- Therapeutic activity, 97112- Neuromuscular re-education, 97535- Self Care, 93818- Manual therapy, [id number]- Canalith repositioning, [id number]- Aquatic Therapy, 97014- Electrical stimulation (unattended), [id number]- Electrical stimulation (manual), [id number]- Vasopneumatic device, [id number]- Ultrasound, [id number]- Traction (mechanical), [id number]- Ionotophoresis 4mg /ml Dexamethasone, Balance training, Stair training, Taping, Dry Needling, Joint mobilization, Joint manipulation, Spinal manipulation, Spinal mobilization, Vestibular training, Cryotherapy, and Moist heat.  PLAN FOR NEXT SESSION: Assess HEP ; assess dry needling response; cervical mobility (MELT, SNAGS) , postural strengthening ; Peyton Najjar, PT 04/20/23 7:29 PM [facility] [address], [address] [place], [place] [address] Phone # [phone] Fax [phone]

## 2023-04-23 ENCOUNTER — Ambulatory Visit: Payer: Managed Care, Other (non HMO)

## 2023-04-23 DIAGNOSIS — M5459 Other low back pain: Secondary | ICD-10-CM

## 2023-04-23 DIAGNOSIS — R293 Abnormal posture: Secondary | ICD-10-CM

## 2023-04-23 DIAGNOSIS — R252 Cramp and spasm: Secondary | ICD-10-CM

## 2023-04-23 DIAGNOSIS — M62838 Other muscle spasm: Secondary | ICD-10-CM

## 2023-04-23 DIAGNOSIS — M542 Cervicalgia: Secondary | ICD-10-CM | POA: Diagnosis not present

## 2023-04-23 DIAGNOSIS — M6281 Muscle weakness (generalized): Secondary | ICD-10-CM

## 2023-04-23 DIAGNOSIS — R262 Difficulty in walking, not elsewhere classified: Secondary | ICD-10-CM

## 2023-04-23 NOTE — Therapy (Signed)
OUTPATIENT PHYSICAL THERAPY THORACOLUMBAR / CERVICAL TREATMENT   Patient Name: Shari Peters MRN: [id number] DOB:Apr 28, 1971, 52 y.o., female Today's Date: 04/23/2023  END OF SESSION:  PT End of Session - 04/23/23 0847     Visit Number 2    Date for PT Re-Evaluation 06/14/22    Authorization Type [employer]    Progress Note Due on Visit 10    PT Start Time [phone]    PT Stop Time 0930    PT Time Calculation (min) 43 min    Activity Tolerance Patient tolerated treatment well    Behavior During Therapy [facility] for tasks assessed/performed             Past Medical History:  Diagnosis Date   Anxiety    Chronic left shoulder pain    Depression    History reviewed. No pertinent surgical history. Patient Active Problem List   Diagnosis Date Noted   Environmental and seasonal allergies 10/24/2015   Moderate episode of recurrent major depressive disorder (HCC) 10/24/2015    PCP: None  REFERRING PROVIDER: Julien Girt, PA-C  REFERRING DIAG: M54.50 (ICD-10-CM) - Low back pain, unspecified M54.12 (ICD-10-CM) - Radiculopathy, cervical region  Rationale for Evaluation and Treatment: Rehabilitation  THERAPY DIAG:  Cervicalgia  Other low back pain  Other muscle spasm  Abnormal posture  Muscle weakness (generalized)  Difficulty in walking, not elsewhere classified  Cramp and spasm  ONSET DATE: 6-7 months ago  SUBJECTIVE:                                                                                                                                                                                           SUBJECTIVE STATEMENT: Patient reports she had some relief with the dry needling.    PERTINENT HISTORY:  Depression; anxiety; ADD  PAIN:  Are you having pain? Yes: NPRS scale: 6(currently) 8(worst)/10 Pain location: Bilateral lower C-spine but left is more painful currently Pain description: dull & achy Aggravating factors: sitting; eaching overhead; sleeping;  turning to look in blind spots. Relieving factors: nothing  PRECAUTIONS: Other: Patient has an unspecified lump on her Lt side under her arm. She is getting a diagnostic breast US and mammogram on Friday 11/15   RED FLAGS: None   WEIGHT BEARING RESTRICTIONS: No  FALLS:  Has patient fallen in last 6 months? No  LIVING ENVIRONMENT: Lives with: lives with their son Lives in: House/apartment Stairs: Yes but she rarely goes up them   OCCUPATION: Works from home ; works in Oncologist  PLOF: Independent  PATIENT GOALS: To not feel like I am falling a part  NEXT MD VISIT: PRN  OBJECTIVE:  Note: Objective measures were completed at Evaluation unless otherwise noted.  DIAGNOSTIC FINDINGS:  None  PATIENT SURVEYS:  NDI 20/50 40%  SCREENING FOR RED FLAGS: Bowel or bladder incontinence: No Spinal tumors: No Cauda equina syndrome: No Compression fracture: No Abdominal aneurysm: No  COGNITION: Overall cognitive status: Within functional limits for tasks assessed     SENSATION: Numbness and tingling ocassionaly in Lt arm  POSTURE: rounded shoulders and forward head  PALPATION: Increased muscle spasms of bilateral upper trap   Cervical ROM : AROM eval  Flexion 40 *pain coming up  Extension 36  Right lateral flexion 35  Left lateral flexion 40  Right rotation 60 *pain  Left rotation         60 *pain    LOWER EXTREMITY ROM:   WFL    FUNCTIONAL TESTS: NT  GAIT:  Comments: Unremarkable  TODAY'S TREATMENT:                                                                                                                              DATE:  04/23/2023: Nustep x 5 min 3 way scapular stabilization with blue loop both 4 D ball rolls with light blue plyo ball both Lat pull down x 20 with 45 lbs Matrix row x 20 with 25 lbs Prone shoulder extension, row and horizontal abduction x 20 with 2 lbs both Side lying ER with 2 lbs x 20 both Supine serratus punch with 2 lbs x  20 both  04/20/2023:  Initial Eval & HEP created Trigger Point Dry-Needling - Performed by Lorrene Reid  Treatment instructions: Expect mild to moderate muscle soreness. S/S of pneumothorax if dry needled over a lung field, and to seek immediate medical attention should they occur. Patient verbalized understanding of these instructions and education.  Patient Consent Given: Yes Education handout provided: Yes Muscles treated: Lt cervical multifidi; Lt upper trap; Lt rhomboids  Electrical stimulation performed: No Parameters: N/A Treatment response/outcome: Utilized skilled palpation to identify trigger points.  During dry needling able to palpate muscle twitch and muscle elongation  PATIENT EDUCATION:  Education details: DN handout; POC; HEP Person educated: Patient Education method: Programmer, multimedia, Demonstration, and Handouts Education comprehension: verbalized understanding, returned demonstration, and needs further education  HOME EXERCISE PROGRAM: Access Code: [email or web address] URL: [email or web address] Date: 04/23/2023 Prepared by: Mikey Kirschner  Exercises - Seated Upper Trapezius Stretch  - 1-2 x daily - 7 x weekly - 2 sets - 20-30 hold - Seated Levator Scapulae Stretch  - 1-2 x daily - 7 x weekly - 2 sets - 20-30 hold - Seated Scapular Retraction  - 1 x daily - 7 x weekly - 2 sets - 3 hold - Prone Shoulder Extension - Single Arm  - 2 x daily - 7 x weekly - 2 sets - 10 reps - Prone Shoulder Row  - 2 x daily - 7 x weekly - 2 sets - 10 reps - Prone Single Arm Shoulder  Horizontal Abduction with Scapular Retraction and Palm Down  - 2 x daily - 7 x weekly - 2 sets - 10 reps - Sidelying Shoulder External Rotation  - 2 x daily - 7 x weekly - 2 sets - 10 reps - Single Arm Serratus Punches in Supine with Dumbbell  - 2 x daily - 7 x weekly - 2 sets - 10 reps - Shoulder Flexion Wall Slide with Resistance Band  - 1 x daily - 7 x weekly - 3 sets - 10 reps  ASSESSMENT:  CLINICAL  IMPRESSION: Shari Peters was able to complete all exercises with min fatigue but felt challenged on the shoulder stabilization exercises.  She was quite weak in left shoulder ER and needed more rest breaks.  She denied any increased pain post exercise session.  She did have some good response to DN and would likely continue to benefit from this.   Patient will benefit from skilled PT to address the below impairments and improve overall function.   OBJECTIVE IMPAIRMENTS: decreased ROM, increased muscle spasms, postural dysfunction, and pain.   ACTIVITY LIMITATIONS: sitting, sleeping, and reach over head  PARTICIPATION LIMITATIONS: cleaning, driving, community activity, and occupation  PERSONAL FACTORS: Time since onset of injury/illness/exacerbation and 1-2 comorbidities: anxiety & depression  are also affecting patient's functional outcome.   REHAB POTENTIAL: Good  CLINICAL DECISION MAKING: Stable/uncomplicated  EVALUATION COMPLEXITY: Low   GOALS: Goals reviewed with patient? Yes  SHORT TERM GOALS: Target date: 05/18/2023  Patient will be independent with initial HEP. Baseline:  Goal status: INITIAL  2.  Patient will report > or = to 30% improvement in symptoms since starting PT. Baseline:  Goal status: INITIAL  3.  Patient will report 30% improvement in sleep quality.  Baseline:  Goal status: INITIAL  LONG TERM GOALS: Target date: 06/15/2023  Patient will demonstrate independence in advanced HEP. Baseline:  Goal status: INITIAL  2.  Patient will report > or = to 70% improvement in symptoms since starting PT. Baseline:  Goal status: INITIAL   3.  Patient will report > or = to 70% in sleep quality.  Baseline:  Goal status: INITIAL  4.  Patient will be able to reach over head with < or = to 2 / 10 pain. Baseline:  Goal status: INITIAL  5.  Patient will be able to begin workout regimen  Baseline:  Goal status: INITIAL    PLAN:  PT FREQUENCY: 2x/week  PT DURATION:  8 weeks  PLANNED INTERVENTIONS: 97164- PT Re-evaluation, 97110-Therapeutic exercises, 97530- Therapeutic activity, 97112- Neuromuscular re-education, 97535- Self Care, 16109- Manual therapy, [id number]- Canalith repositioning, [id number]- Aquatic Therapy, 97014- Electrical stimulation (unattended), [id number]- Electrical stimulation (manual), [id number]- Vasopneumatic device, [id number]- Ultrasound, [id number]- Traction (mechanical), [id number]- Ionotophoresis 4mg /ml Dexamethasone, Balance training, Stair training, Taping, Dry Needling, Joint mobilization, Joint manipulation, Spinal manipulation, Spinal mobilization, Vestibular training, Cryotherapy, and Moist heat.  PLAN FOR NEXT SESSION: Assess response to additions to HEP ; dry needling ; cervical mobility (MELT, SNAGS) , postural strengthening ; FOTO   Terecia Plaut B. Makinzey Banes, PT 04/23/23 11:18 AM [facility] [address], [address] [place], [place] [address] Phone # [phone] Fax [phone]

## 2023-04-24 NOTE — Therapy (Signed)
OUTPATIENT PHYSICAL THERAPY THORACOLUMBAR / CERVICAL TREATMENT   Patient Name: Shari Peters MRN: [id number] DOB:26-Aug-1970, 52 y.o., female Today's Date: 04/27/2023  END OF SESSION:  PT End of Session - 04/27/23 0841     Visit Number 3    Date for PT Re-Evaluation 06/14/22    Authorization Type [employer]    Progress Note Due on Visit 10    PT Start Time 0800    PT Stop Time 0841    PT Time Calculation (min) 41 min              Past Medical History:  Diagnosis Date   Anxiety    Chronic left shoulder pain    Depression    History reviewed. No pertinent surgical history. Patient Active Problem List   Diagnosis Date Noted   Environmental and seasonal allergies 10/24/2015   Moderate episode of recurrent major depressive disorder (HCC) 10/24/2015    PCP: None  REFERRING PROVIDER: Julien Girt, PA-C  REFERRING DIAG: M54.50 (ICD-10-CM) - Low back pain, unspecified M54.12 (ICD-10-CM) - Radiculopathy, cervical region  Rationale for Evaluation and Treatment: Rehabilitation  THERAPY DIAG:  Cervicalgia  Other low back pain  Other muscle spasm  Abnormal posture  Muscle weakness (generalized)  Difficulty in walking, not elsewhere classified  Cramp and spasm  ONSET DATE: 6-7 months ago  SUBJECTIVE:                                                                                                                                                                                           SUBJECTIVE STATEMENT: I think I slept on it wrong last  night. This is the most movement I've had in a long time.  PERTINENT HISTORY:  Depression; anxiety; ADD  PAIN:  Are you having pain? Yes: NPRS scale: 2-3 (currently) 8(worst)/10 Pain location: Bilateral lower C-spine but left is more painful currently Pain description: dull & achy Aggravating factors: sitting; eaching overhead; sleeping; turning to look in blind spots. Relieving factors: nothing  PRECAUTIONS: Other:  Patient has an unspecified lump on her Lt side under her arm. She is getting a diagnostic breast US and mammogram on Friday 11/15   RED FLAGS: None   WEIGHT BEARING RESTRICTIONS: No  FALLS:  Has patient fallen in last 6 months? No  LIVING ENVIRONMENT: Lives with: lives with their son Lives in: House/apartment Stairs: Yes but she rarely goes up them   OCCUPATION: Works from home ; works in Oncologist  PLOF: Independent  PATIENT GOALS: To not feel like I am falling a part  NEXT MD VISIT: PRN  OBJECTIVE:  Note: Objective measures were completed at  Evaluation unless otherwise noted.  DIAGNOSTIC FINDINGS:  None  PATIENT SURVEYS:  NDI 20/50 40%  SCREENING FOR RED FLAGS: Bowel or bladder incontinence: No Spinal tumors: No Cauda equina syndrome: No Compression fracture: No Abdominal aneurysm: No  COGNITION: Overall cognitive status: Within functional limits for tasks assessed     SENSATION: Numbness and tingling ocassionaly in Lt arm  POSTURE: rounded shoulders and forward head  PALPATION: Increased muscle spasms of bilateral upper trap   Cervical ROM : AROM eval  Flexion 40 *pain coming up  Extension 36  Right lateral flexion 35  Left lateral flexion 40  Right rotation 60 *pain  Left rotation         60 *pain    LOWER EXTREMITY ROM:   WFL    FUNCTIONAL TESTS: NT  GAIT:  Comments: Unremarkable  TODAY'S TREATMENT:                                                                                                                              DATE:  04/27/2023: Nustep L6x 5 min 3 way scapular stabilization x 10 ea with blue loop both 4 D ball rolls with light blue plyo ball both Prone shoulder extension,wide row and horizontal abduction x 20 with 2 lbs both Side lying ER with 2 lbs x 20 both Supine serratus punch with 2 lbs x 20 both Skilled palpation and monitoring of soft tissues during DN Trigger Point Dry-Needling  Treatment instructions: Expect  mild to moderate muscle soreness. S/S of pneumothorax if dry needled over a lung field, and to seek immediate medical attention should they occur. Patient verbalized understanding of these instructions and education. Patient Consent Given: Yes Education handout provided: Previously provided Muscles treated: B UT, LS and cervical multifidi Electrical stimulation performed: No Parameters: N/A Treatment response/outcome: Twitch Response Elicited and Palpable Increase in Muscle Length    04/23/2023: Nustep x 5 min 3 way scapular stabilization with blue loop both 4 D ball rolls with light blue plyo ball both Lat pull down x 20 with 45 lbs Matrix row x 20 with 25 lbs Prone shoulder extension, row and horizontal abduction x 20 with 2 lbs both Side lying ER with 2 lbs x 20 both Supine serratus punch with 2 lbs x 20 both  04/20/2023:  Initial Eval & HEP created Trigger Point Dry-Needling - Performed by Lorrene Reid  Treatment instructions: Expect mild to moderate muscle soreness. S/S of pneumothorax if dry needled over a lung field, and to seek immediate medical attention should they occur. Patient verbalized understanding of these instructions and education.  Patient Consent Given: Yes Education handout provided: Yes Muscles treated: Lt cervical multifidi; Lt upper trap; Lt rhomboids  Electrical stimulation performed: No Parameters: N/A Treatment response/outcome: Utilized skilled palpation to identify trigger points.  During dry needling able to palpate muscle twitch and muscle elongation  PATIENT EDUCATION:  Education details: DN handout; POC; HEP Person educated: Patient Education method: Programmer, multimedia, Demonstration,  and Handouts Education comprehension: verbalized understanding, returned demonstration, and needs further education  HOME EXERCISE PROGRAM: Access Code: [email or web address] URL: [email or web address] Date: 04/23/2023 Prepared by: Mikey Kirschner  Exercises -  Seated Upper Trapezius Stretch  - 1-2 x daily - 7 x weekly - 2 sets - 20-30 hold - Seated Levator Scapulae Stretch  - 1-2 x daily - 7 x weekly - 2 sets - 20-30 hold - Seated Scapular Retraction  - 1 x daily - 7 x weekly - 2 sets - 3 hold - Prone Shoulder Extension - Single Arm  - 2 x daily - 7 x weekly - 2 sets - 10 reps - Prone Shoulder Row  - 2 x daily - 7 x weekly - 2 sets - 10 reps - Prone Single Arm Shoulder Horizontal Abduction with Scapular Retraction and Palm Down  - 2 x daily - 7 x weekly - 2 sets - 10 reps - Sidelying Shoulder External Rotation  - 2 x daily - 7 x weekly - 2 sets - 10 reps - Single Arm Serratus Punches in Supine with Dumbbell  - 2 x daily - 7 x weekly - 2 sets - 10 reps - Shoulder Flexion Wall Slide with Resistance Band  - 1 x daily - 7 x weekly - 3 sets - 10 reps  ASSESSMENT:  CLINICAL IMPRESSION: Eunice Blase continues to demonstrate weakness with scapular stabilization and L ER. She demonstrated full left cervical rotation today with no pain after manual therapy/DN. She still lacks full motion on the R and she had tightness and mild pain with R rotation today as well. Good response to DN/MT Bilaterally. Decreased overall tension noted in L side.    OBJECTIVE IMPAIRMENTS: decreased ROM, increased muscle spasms, postural dysfunction, and pain.   ACTIVITY LIMITATIONS: sitting, sleeping, and reach over head  PARTICIPATION LIMITATIONS: cleaning, driving, community activity, and occupation  PERSONAL FACTORS: Time since onset of injury/illness/exacerbation and 1-2 comorbidities: anxiety & depression  are also affecting patient's functional outcome.   REHAB POTENTIAL: Good  CLINICAL DECISION MAKING: Stable/uncomplicated  EVALUATION COMPLEXITY: Low   GOALS: Goals reviewed with patient? Yes  SHORT TERM GOALS: Target date: 05/18/2023  Patient will be independent with initial HEP. Baseline:  Goal status: INITIAL  2.  Patient will report > or = to 30% improvement in  symptoms since starting PT. Baseline:  Goal status: INITIAL  3.  Patient will report 30% improvement in sleep quality.  Baseline:  Goal status: INITIAL  LONG TERM GOALS: Target date: 06/15/2023  Patient will demonstrate independence in advanced HEP. Baseline:  Goal status: INITIAL  2.  Patient will report > or = to 70% improvement in symptoms since starting PT. Baseline:  Goal status: INITIAL   3.  Patient will report > or = to 70% in sleep quality.  Baseline:  Goal status: INITIAL  4.  Patient will be able to reach over head with < or = to 2 / 10 pain. Baseline:  Goal status: INITIAL  5.  Patient will be able to begin workout regimen  Baseline:  Goal status: INITIAL    PLAN:  PT FREQUENCY: 2x/week  PT DURATION: 8 weeks  PLANNED INTERVENTIONS: 97164- PT Re-evaluation, 97110-Therapeutic exercises, 97530- Therapeutic activity, 97112- Neuromuscular re-education, 97535- Self Care, 13244- Manual therapy, [id number]- Canalith repositioning, [id number]- Aquatic Therapy, 97014- Electrical stimulation (unattended), [id number]- Electrical stimulation (manual), [id number]- Vasopneumatic device, [id number]- Ultrasound, [id number]- Traction (mechanical), [id number]- Ionotophoresis 4mg /ml Dexamethasone, Balance training, Stair training, Taping, Dry Needling, Joint mobilization, Joint manipulation, Spinal manipulation,  Spinal mobilization, Vestibular training, Cryotherapy, and Moist heat.  PLAN FOR NEXT SESSION: Assess response to additions to HEP ; dry needling ; cervical mobility (MELT, SNAGS) , postural strengthening ; Joycelyn Man, PT  04/27/23 8:45 AM [facility] [address] [place], [place] [address] Phone # [phone] Fax [phone]

## 2023-04-27 ENCOUNTER — Ambulatory Visit: Payer: Managed Care, Other (non HMO) | Admitting: Physical Therapy

## 2023-04-27 ENCOUNTER — Encounter: Payer: Self-pay | Admitting: Physical Therapy

## 2023-04-27 DIAGNOSIS — M6281 Muscle weakness (generalized): Secondary | ICD-10-CM

## 2023-04-27 DIAGNOSIS — R262 Difficulty in walking, not elsewhere classified: Secondary | ICD-10-CM

## 2023-04-27 DIAGNOSIS — M62838 Other muscle spasm: Secondary | ICD-10-CM

## 2023-04-27 DIAGNOSIS — M542 Cervicalgia: Secondary | ICD-10-CM | POA: Diagnosis not present

## 2023-04-27 DIAGNOSIS — M5459 Other low back pain: Secondary | ICD-10-CM

## 2023-04-27 DIAGNOSIS — R252 Cramp and spasm: Secondary | ICD-10-CM

## 2023-04-27 DIAGNOSIS — R293 Abnormal posture: Secondary | ICD-10-CM

## 2023-04-30 ENCOUNTER — Ambulatory Visit: Payer: Managed Care, Other (non HMO) | Admitting: Physical Therapy

## 2023-05-05 ENCOUNTER — Encounter: Payer: Self-pay | Admitting: Physical Therapy

## 2023-05-05 ENCOUNTER — Ambulatory Visit: Payer: Managed Care, Other (non HMO) | Admitting: Physical Therapy

## 2023-05-05 DIAGNOSIS — M542 Cervicalgia: Secondary | ICD-10-CM

## 2023-05-05 DIAGNOSIS — R262 Difficulty in walking, not elsewhere classified: Secondary | ICD-10-CM

## 2023-05-05 DIAGNOSIS — M5459 Other low back pain: Secondary | ICD-10-CM

## 2023-05-05 DIAGNOSIS — M62838 Other muscle spasm: Secondary | ICD-10-CM

## 2023-05-05 DIAGNOSIS — M6281 Muscle weakness (generalized): Secondary | ICD-10-CM

## 2023-05-05 DIAGNOSIS — R252 Cramp and spasm: Secondary | ICD-10-CM

## 2023-05-05 DIAGNOSIS — R293 Abnormal posture: Secondary | ICD-10-CM

## 2023-05-05 NOTE — Therapy (Signed)
OUTPATIENT PHYSICAL THERAPY THORACOLUMBAR / CERVICAL TREATMENT   Patient Name: Shari Peters MRN: [id number] DOB:[birth year], 52 y.o., female Today's Date: 05/05/2023  END OF SESSION:  PT End of Session - 05/05/23 0852     Visit Number 4    Date for PT Re-Evaluation 06/14/22    Authorization Type [employer]    Progress Note Due on Visit 10    PT Start Time 0800    PT Stop Time 0841    PT Time Calculation (min) 41 min    Activity Tolerance Patient tolerated treatment well    Behavior During Therapy [facility] for tasks assessed/performed               Past Medical History:  Diagnosis Date   Anxiety    Chronic left shoulder pain    Depression    History reviewed. No pertinent surgical history. Patient Active Problem List   Diagnosis Date Noted   Environmental and seasonal allergies 10/24/2015   Moderate episode of recurrent major depressive disorder (HCC) 10/24/2015    PCP: None  REFERRING PROVIDER: Julien Girt, PA-C  REFERRING DIAG: M54.50 (ICD-10-CM) - Low back pain, unspecified M54.12 (ICD-10-CM) - Radiculopathy, cervical region  Rationale for Evaluation and Treatment: Rehabilitation  THERAPY DIAG:  Cervicalgia  Other low back pain  Other muscle spasm  Abnormal posture  Muscle weakness (generalized)  Cramp and spasm  Difficulty in walking, not elsewhere classified  ONSET DATE: 6-7 months ago  SUBJECTIVE:                                                                                                                                                                                           SUBJECTIVE STATEMENT: I need dry needling today. My neck feels very stiff.   PERTINENT HISTORY:  Depression; anxiety; ADD  PAIN: 05/05/2023 Are you having pain? Yes: NPRS scale: 4/10 Pain location: Bilateral lower C-spine but left is more painful currently Pain description: dull & achy Aggravating factors: sitting; eaching overhead; sleeping; turning to  look in blind spots. Relieving factors: nothing  PRECAUTIONS: Other: Patient has an unspecified lump on her Lt side under her arm. She is getting a diagnostic breast US and mammogram on Friday 11/15   RED FLAGS: None   WEIGHT BEARING RESTRICTIONS: No  FALLS:  Has patient fallen in last 6 months? No  LIVING ENVIRONMENT: Lives with: lives with their son Lives in: House/apartment Stairs: Yes but she rarely goes up them   OCCUPATION: Works from home ; works in Oncologist  PLOF: Independent  PATIENT GOALS: To not feel like I am falling a part  NEXT MD VISIT: PRN  OBJECTIVE:  Note: Objective measures were completed at Evaluation unless otherwise noted.  DIAGNOSTIC FINDINGS:  None  PATIENT SURVEYS:  NDI 20/50 40%  SCREENING FOR RED FLAGS: Bowel or bladder incontinence: No Spinal tumors: No Cauda equina syndrome: No Compression fracture: No Abdominal aneurysm: No  COGNITION: Overall cognitive status: Within functional limits for tasks assessed     SENSATION: Numbness and tingling ocassionaly in Lt arm  POSTURE: rounded shoulders and forward head  PALPATION: Increased muscle spasms of bilateral upper trap   Cervical ROM : AROM eval  Flexion 40 *pain coming up  Extension 36  Right lateral flexion 35  Left lateral flexion 40  Right rotation 60 *pain  Left rotation         60 *pain    LOWER EXTREMITY ROM:   WFL    FUNCTIONAL TESTS: NT  GAIT:  Comments: Unremarkable  TODAY'S TREATMENT:                                                                                                                              DATE:  05/05/2023: UBE 6 mins (3 forward/ 3 backward) - PT present to discuss status 3 way scapular stabilization x 10 ea with yellow loop both 4 D ball rolls with blue ball (couldn't find blue plyo ball) Prone shoulder extension,wide row and horizontal abduction x 20 with 2 lbs both Side lying ER with 2 lbs x 20 both Cervical Melt Method  (flexion/ rotation) x15 Skilled palpation and monitoring of soft tissues during DN Trigger Point Dry-Needling - performed by Reather Laurence, PT Treatment instructions: Expect mild to moderate muscle soreness. S/S of pneumothorax if dry needled over a lung field, and to seek immediate medical attention should they occur. Patient verbalized understanding of these instructions and education. Patient Consent Given: Yes Education handout provided: Previously provided Muscles treated: Bilat upper trap, bilat levator, cervical multifidi, bilat rhomboids Electrical stimulation performed: No Parameters: N/A Treatment response/outcome: Utilized skilled palpation to identify trigger points and bony landmarks.  Able to illicit twitch response and muscle elongation.  Manual soft tissue mobilization following to further promote tissue elongation.  04/27/2023: Nustep L6x 5 min 3 way scapular stabilization x 10 ea with blue loop both 4 D ball rolls with light blue plyo ball both Prone shoulder extension,wide row and horizontal abduction x 20 with 2 lbs both Side lying ER with 2 lbs x 20 both Supine serratus punch with 2 lbs x 20 both Skilled palpation and monitoring of soft tissues during DN Trigger Point Dry-Needling  Treatment instructions: Expect mild to moderate muscle soreness. S/S of pneumothorax if dry needled over a lung field, and to seek immediate medical attention should they occur. Patient verbalized understanding of these instructions and education. Patient Consent Given: Yes Education handout provided: Previously provided Muscles treated: B UT, LS and cervical multifidi Electrical stimulation performed: No Parameters: N/A Treatment response/outcome: Twitch Response Elicited and Palpable Increase in Muscle Length  04/23/2023: Nustep x 5 min 3 way scapular stabilization with blue loop both 4 D ball rolls with light blue plyo ball both Lat pull down x 20 with 45 lbs Matrix row x 20 with  25 lbs Prone shoulder extension, row and horizontal abduction x 20 with 2 lbs both Side lying ER with 2 lbs x 20 both Supine serratus punch with 2 lbs x 20 both  04/20/2023:  Initial Eval & HEP created Trigger Point Dry-Needling - Performed by Lorrene Reid  Treatment instructions: Expect mild to moderate muscle soreness. S/S of pneumothorax if dry needled over a lung field, and to seek immediate medical attention should they occur. Patient verbalized understanding of these instructions and education.  Patient Consent Given: Yes Education handout provided: Yes Muscles treated: Lt cervical multifidi; Lt upper trap; Lt rhomboids  Electrical stimulation performed: No Parameters: N/A Treatment response/outcome: Utilized skilled palpation to identify trigger points.  During dry needling able to palpate muscle twitch and muscle elongation  PATIENT EDUCATION:  Education details: DN handout; POC; HEP Person educated: Patient Education method: Programmer, multimedia, Demonstration, and Handouts Education comprehension: verbalized understanding, returned demonstration, and needs further education  HOME EXERCISE PROGRAM: Access Code: [email or web address] URL: [email or web address] Date: 04/23/2023 Prepared by: Mikey Kirschner  Exercises - Seated Upper Trapezius Stretch  - 1-2 x daily - 7 x weekly - 2 sets - 20-30 hold - Seated Levator Scapulae Stretch  - 1-2 x daily - 7 x weekly - 2 sets - 20-30 hold - Seated Scapular Retraction  - 1 x daily - 7 x weekly - 2 sets - 3 hold - Prone Shoulder Extension - Single Arm  - 2 x daily - 7 x weekly - 2 sets - 10 reps - Prone Shoulder Row  - 2 x daily - 7 x weekly - 2 sets - 10 reps - Prone Single Arm Shoulder Horizontal Abduction with Scapular Retraction and Palm Down  - 2 x daily - 7 x weekly - 2 sets - 10 reps - Sidelying Shoulder External Rotation  - 2 x daily - 7 x weekly - 2 sets - 10 reps - Single Arm Serratus Punches in Supine with Dumbbell  - 2 x daily -  7 x weekly - 2 sets - 10 reps - Shoulder Flexion Wall Slide with Resistance Band  - 1 x daily - 7 x weekly - 3 sets - 10 reps  ASSESSMENT:  CLINICAL IMPRESSION: Eunice Blase reports her neck is better since starting therapy. She was sick last week and was not able to make her appointment and she feels that aggravated her neck pain. She tolerated treatment session well and did not verbalize any increased pain. Patient required verbal and visual cues for correct exercise performance. Patient will benefit from skilled PT to address the below impairments and improve overall function.    OBJECTIVE IMPAIRMENTS: decreased ROM, increased muscle spasms, postural dysfunction, and pain.   ACTIVITY LIMITATIONS: sitting, sleeping, and reach over head  PARTICIPATION LIMITATIONS: cleaning, driving, community activity, and occupation  PERSONAL FACTORS: Time since onset of injury/illness/exacerbation and 1-2 comorbidities: anxiety & depression  are also affecting patient's functional outcome.   REHAB POTENTIAL: Good  CLINICAL DECISION MAKING: Stable/uncomplicated  EVALUATION COMPLEXITY: Low   GOALS: Goals reviewed with patient? Yes  SHORT TERM GOALS: Target date: 05/18/2023  Patient will be independent with initial HEP. Baseline:  Goal status: INITIAL  2.  Patient will report > or = to 30% improvement in symptoms since starting PT. Baseline:  Goal status:  INITIAL  3.  Patient will report 30% improvement in sleep quality.  Baseline:  Goal status: INITIAL  LONG TERM GOALS: Target date: 06/15/2023  Patient will demonstrate independence in advanced HEP. Baseline:  Goal status: INITIAL  2.  Patient will report > or = to 70% improvement in symptoms since starting PT. Baseline:  Goal status: INITIAL   3.  Patient will report > or = to 70% in sleep quality.  Baseline:  Goal status: INITIAL  4.  Patient will be able to reach over head with < or = to 2 / 10 pain. Baseline:  Goal status:  INITIAL  5.  Patient will be able to begin workout regimen  Baseline:  Goal status: INITIAL    PLAN:  PT FREQUENCY: 2x/week  PT DURATION: 8 weeks  PLANNED INTERVENTIONS: 97164- PT Re-evaluation, 97110-Therapeutic exercises, 97530- Therapeutic activity, 97112- Neuromuscular re-education, 97535- Self Care, 78295- Manual therapy, [id number]- Canalith repositioning, [id number]- Aquatic Therapy, 97014- Electrical stimulation (unattended), [id number]- Electrical stimulation (manual), [id number]- Vasopneumatic device, [id number]- Ultrasound, [id number]- Traction (mechanical), [id number]- Ionotophoresis 4mg /ml Dexamethasone, Balance training, Stair training, Taping, Dry Needling, Joint mobilization, Joint manipulation, Spinal manipulation, Spinal mobilization, Vestibular training, Cryotherapy, and Moist heat.  PLAN FOR NEXT SESSION: continue postural strengthening ; FOTO; cervical mobility    Claude Manges, PT 05/05/23 8:53 AM [facility] [address], [address] [place], [place] [address] Phone # [phone] Fax [phone]

## 2023-05-11 NOTE — Therapy (Signed)
OUTPATIENT PHYSICAL THERAPY THORACOLUMBAR / CERVICAL TREATMENT   Patient Name: Shari Peters MRN: [id number] DOB:10-27-1970, 52 y.o., female Today's Date: 05/12/2023  END OF SESSION:  PT End of Session - 05/12/23 0933     Visit Number 5    Date for PT Re-Evaluation 06/14/22    Authorization Type [employer]    Progress Note Due on Visit 10    PT Start Time 0933    PT Stop Time 1015    PT Time Calculation (min) 42 min    Activity Tolerance Patient tolerated treatment well    Behavior During Therapy WFL for tasks assessed/performed                Past Medical History:  Diagnosis Date   Anxiety    Chronic left shoulder pain    Depression    History reviewed. No pertinent surgical history. Patient Active Problem List   Diagnosis Date Noted   Environmental and seasonal allergies 10/24/2015   Moderate episode of recurrent major depressive disorder (HCC) 10/24/2015    PCP: None  REFERRING PROVIDER: Julien Girt, PA-C  REFERRING DIAG: M54.50 (ICD-10-CM) - Low back pain, unspecified M54.12 (ICD-10-CM) - Radiculopathy, cervical region  Rationale for Evaluation and Treatment: Rehabilitation  THERAPY DIAG:  Cervicalgia  ONSET DATE: 6-7 months ago  SUBJECTIVE:                                                                                                                                                                                           SUBJECTIVE STATEMENT: I'm really tight today. I feel like I need a new mattress.  PERTINENT HISTORY:  Depression; anxiety; ADD  PAIN: 05/05/2023 Are you having pain? Yes: NPRS scale: 5/10 Pain location: Bilateral lower C-spine but left is more painful currently Pain description: dull & achy Aggravating factors: sitting; eaching overhead; sleeping; turning to look in blind spots. Relieving factors: nothing  PRECAUTIONS: Other: Patient has an unspecified lump on her Lt side under her arm. She is getting a diagnostic  breast US and mammogram on Friday 11/15   RED FLAGS: None   WEIGHT BEARING RESTRICTIONS: No  FALLS:  Has patient fallen in last 6 months? No  LIVING ENVIRONMENT: Lives with: lives with their son Lives in: House/apartment Stairs: Yes but she rarely goes up them   OCCUPATION: Works from home ; works in Oncologist  PLOF: Independent  PATIENT GOALS: To not feel like I am falling a part  NEXT MD VISIT: PRN  OBJECTIVE:  Note: Objective measures were completed at Evaluation unless otherwise noted.  DIAGNOSTIC FINDINGS:  None  PATIENT SURVEYS:  NDI 20/50 40%  SCREENING FOR RED FLAGS: Bowel or bladder incontinence: No Spinal tumors: No Cauda equina syndrome: No Compression fracture: No Abdominal aneurysm: No  COGNITION: Overall cognitive status: Within functional limits for tasks assessed     SENSATION: Numbness and tingling ocassionaly in Lt arm  POSTURE: rounded shoulders and forward head  PALPATION: Increased muscle spasms of bilateral upper trap   Cervical ROM : AROM eval  Flexion 40 *pain coming up  Extension 36  Right lateral flexion 35  Left lateral flexion 40  Right rotation 60 *pain  Left rotation         60 *pain    LOWER EXTREMITY ROM:   WFL    FUNCTIONAL TESTS: NT  GAIT:  Comments: Unremarkable  TODAY'S TREATMENT:                                                                                                                              DATE:  05/12/2023: UBE 6 mins (3 forward/ 3 backward) - PT present to discuss status Standing flex and ABD 2# 2x10 ea Standing shrugs 2x10 2# Standing ER with retraction 2# 2x 10 3 way scapular stabilization x 10 ea with yellow loop both Prone shoulder extension,wide row and horizontal abduction x 20 with 2 lbs both Skilled palpation and monitoring of soft tissues during DN. STM to B UT and cervical paraspinals.  Trigger Point Dry-Needling -  Treatment instructions: Expect mild to moderate muscle  soreness. S/S of pneumothorax if dry needled over a lung field, and to seek immediate medical attention should they occur. Patient verbalized understanding of these instructions and education. Patient Consent Given: Yes Education handout provided: Previously provided Muscles treated: Bilat upper trap, cervical multifidi Electrical stimulation performed: No Parameters: N/A Treatment response/outcome: Utilized skilled palpation to identify trigger points and bony landmarks.  Able to illicit twitch response and muscle elongation.  Manual soft tissue mobilization following to further promote tissue elongation.  05/05/2023: UBE 6 mins (3 forward/ 3 backward) - PT present to discuss status 3 way scapular stabilization x 10 ea with yellow loop both 4 D ball rolls with blue ball (couldn't find blue plyo ball) Prone shoulder extension,wide row and horizontal abduction x 20 with 2 lbs both Side lying ER with 2 lbs x 20 both Cervical Melt Method (flexion/ rotation) x15 Skilled palpation and monitoring of soft tissues during DN Trigger Point Dry-Needling - performed by Reather Laurence, PT Treatment instructions: Expect mild to moderate muscle soreness. S/S of pneumothorax if dry needled over a lung field, and to seek immediate medical attention should they occur. Patient verbalized understanding of these instructions and education. Patient Consent Given: Yes Education handout provided: Previously provided Muscles treated: Bilat upper trap, bilat levator, cervical multifidi, bilat rhomboids Electrical stimulation performed: No Parameters: N/A Treatment response/outcome: Utilized skilled palpation to identify trigger points and bony landmarks.  Able to illicit twitch response and muscle elongation.  Manual soft tissue mobilization following to further promote tissue elongation.  04/27/2023: Nustep L6x 5 min 3 way scapular stabilization x 10 ea with blue loop both 4 D ball rolls with light blue plyo ball  both Prone shoulder extension,wide row and horizontal abduction x 20 with 2 lbs both Side lying ER with 2 lbs x 20 both Supine serratus punch with 2 lbs x 20 both Skilled palpation and monitoring of soft tissues during DN Trigger Point Dry-Needling  Treatment instructions: Expect mild to moderate muscle soreness. S/S of pneumothorax if dry needled over a lung field, and to seek immediate medical attention should they occur. Patient verbalized understanding of these instructions and education. Patient Consent Given: Yes Education handout provided: Previously provided Muscles treated: B UT, LS and cervical multifidi Electrical stimulation performed: No Parameters: N/A Treatment response/outcome: Twitch Response Elicited and Palpable Increase in Muscle Length    04/23/2023: Nustep x 5 min 3 way scapular stabilization with blue loop both 4 D ball rolls with light blue plyo ball both Lat pull down x 20 with 45 lbs Matrix row x 20 with 25 lbs Prone shoulder extension, row and horizontal abduction x 20 with 2 lbs both Side lying ER with 2 lbs x 20 both Supine serratus punch with 2 lbs x 20 both  04/20/2023:  Initial Eval & HEP created Trigger Point Dry-Needling - Performed by Lorrene Reid  Treatment instructions: Expect mild to moderate muscle soreness. S/S of pneumothorax if dry needled over a lung field, and to seek immediate medical attention should they occur. Patient verbalized understanding of these instructions and education.  Patient Consent Given: Yes Education handout provided: Yes Muscles treated: Lt cervical multifidi; Lt upper trap; Lt rhomboids  Electrical stimulation performed: No Parameters: N/A Treatment response/outcome: Utilized skilled palpation to identify trigger points.  During dry needling able to palpate muscle twitch and muscle elongation  PATIENT EDUCATION:  Education details: DN handout; POC; HEP Person educated: Patient Education method: Programmer, multimedia,  Demonstration, and Handouts Education comprehension: verbalized understanding, returned demonstration, and needs further education  HOME EXERCISE PROGRAM: Access Code: [email or web address] URL: [email or web address] Date: 04/23/2023 Prepared by: Mikey Kirschner  Exercises - Seated Upper Trapezius Stretch  - 1-2 x daily - 7 x weekly - 2 sets - 20-30 hold - Seated Levator Scapulae Stretch  - 1-2 x daily - 7 x weekly - 2 sets - 20-30 hold - Seated Scapular Retraction  - 1 x daily - 7 x weekly - 2 sets - 3 hold - Prone Shoulder Extension - Single Arm  - 2 x daily - 7 x weekly - 2 sets - 10 reps - Prone Shoulder Row  - 2 x daily - 7 x weekly - 2 sets - 10 reps - Prone Single Arm Shoulder Horizontal Abduction with Scapular Retraction and Palm Down  - 2 x daily - 7 x weekly - 2 sets - 10 reps - Sidelying Shoulder External Rotation  - 2 x daily - 7 x weekly - 2 sets - 10 reps - Single Arm Serratus Punches in Supine with Dumbbell  - 2 x daily - 7 x weekly - 2 sets - 10 reps - Shoulder Flexion Wall Slide with Resistance Band  - 1 x daily - 7 x weekly - 3 sets - 10 reps  ASSESSMENT:  CLINICAL IMPRESSION: Shari Peters presents with increased pain and stiffness today which she attributes somewhat to the stress of the holidays. She was able to tolerate all exercises without increased neck pain. She does need some cueing to control the motions and avoid momentum. She  was very tight with trigger points in B UT. Excellent response to MT/DN with decreased pain reported at end of session. Advised patient to set timer to remind her to do postural exercises and neck ROM during the work day. She continues to have difficulty sleeping.  OBJECTIVE IMPAIRMENTS: decreased ROM, increased muscle spasms, postural dysfunction, and pain.   ACTIVITY LIMITATIONS: sitting, sleeping, and reach over head  PARTICIPATION LIMITATIONS: cleaning, driving, community activity, and occupation  PERSONAL FACTORS: Time since onset of  injury/illness/exacerbation and 1-2 comorbidities: anxiety & depression  are also affecting patient's functional outcome.   REHAB POTENTIAL: Good  CLINICAL DECISION MAKING: Stable/uncomplicated  EVALUATION COMPLEXITY: Low   GOALS: Goals reviewed with patient? Yes  SHORT TERM GOALS: Target date: 05/18/2023  Patient will be independent with initial HEP. Baseline:  Goal status: IN PROGRESS  2.  Patient will report > or = to 30% improvement in symptoms since starting PT. Baseline:  Goal status: INITIAL  3.  Patient will report 30% improvement in sleep quality.  Baseline:  Goal status: IN PROGRESS  LONG TERM GOALS: Target date: 06/15/2023  Patient will demonstrate independence in advanced HEP. Baseline:  Goal status: INITIAL  2.  Patient will report > or = to 70% improvement in symptoms since starting PT. Baseline:  Goal status: INITIAL   3.  Patient will report > or = to 70% in sleep quality.  Baseline:  Goal status: INITIAL  4.  Patient will be able to reach over head with < or = to 2 / 10 pain. Baseline:  Goal status: INITIAL  5.  Patient will be able to begin workout regimen  Baseline:  Goal status: INITIAL    PLAN:  PT FREQUENCY: 2x/week  PT DURATION: 8 weeks  PLANNED INTERVENTIONS: 97164- PT Re-evaluation, 97110-Therapeutic exercises, 97530- Therapeutic activity, 97112- Neuromuscular re-education, 97535- Self Care, 45409- Manual therapy, [id number]- Canalith repositioning, [id number]- Aquatic Therapy, 97014- Electrical stimulation (unattended), [id number]- Electrical stimulation (manual), [id number]- Vasopneumatic device, [id number]- Ultrasound, [id number]- Traction (mechanical), [id number]- Ionotophoresis 4mg /ml Dexamethasone, Balance training, Stair training, Taping, Dry Needling, Joint mobilization, Joint manipulation, Spinal manipulation, Spinal mobilization, Vestibular training, Cryotherapy, and Moist heat.  PLAN FOR NEXT SESSION: continue postural strengthening ; FOTO; cervical mobility     Solon Palm, PT  05/12/23 10:24 AM [facility] [address], [address] [place], [place] [address] Phone # [phone] Fax [phone]

## 2023-05-12 ENCOUNTER — Encounter: Payer: Self-pay | Admitting: Physical Therapy

## 2023-05-12 ENCOUNTER — Ambulatory Visit: Payer: Managed Care, Other (non HMO) | Attending: Physician Assistant | Admitting: Physical Therapy

## 2023-05-12 DIAGNOSIS — R293 Abnormal posture: Secondary | ICD-10-CM | POA: Insufficient documentation

## 2023-05-12 DIAGNOSIS — M62838 Other muscle spasm: Secondary | ICD-10-CM | POA: Insufficient documentation

## 2023-05-12 DIAGNOSIS — M6281 Muscle weakness (generalized): Secondary | ICD-10-CM | POA: Diagnosis present

## 2023-05-12 DIAGNOSIS — M5459 Other low back pain: Secondary | ICD-10-CM | POA: Insufficient documentation

## 2023-05-12 DIAGNOSIS — M542 Cervicalgia: Secondary | ICD-10-CM | POA: Diagnosis present

## 2023-05-13 NOTE — Therapy (Incomplete)
OUTPATIENT PHYSICAL THERAPY THORACOLUMBAR / CERVICAL TREATMENT   Patient Name: Shari Peters MRN: [id number] DOB:February 10, 1971, 52 y.o., female Today's Date: 05/13/2023  END OF SESSION:       Past Medical History:  Diagnosis Date   Anxiety    Chronic left shoulder pain    Depression    No past surgical history on file. Patient Active Problem List   Diagnosis Date Noted   Environmental and seasonal allergies 10/24/2015   Moderate episode of recurrent major depressive disorder (HCC) 10/24/2015    PCP: None  REFERRING PROVIDER: Julien Girt, PA-C  REFERRING DIAG: M54.50 (ICD-10-CM) - Low back pain, unspecified M54.12 (ICD-10-CM) - Radiculopathy, cervical region  Rationale for Evaluation and Treatment: Rehabilitation  THERAPY DIAG:  No diagnosis found.  ONSET DATE: 6-7 months ago  SUBJECTIVE:                                                                                                                                                                                           SUBJECTIVE STATEMENT: ***.  PERTINENT HISTORY:  Depression; anxiety; ADD  PAIN: 05/05/2023 Are you having pain? Yes: NPRS scale: 5/10 Pain location: Bilateral lower C-spine but left is more painful currently Pain description: dull & achy Aggravating factors: sitting; eaching overhead; sleeping; turning to look in blind spots. Relieving factors: nothing  PRECAUTIONS: Other: Patient has an unspecified lump on her Lt side under her arm. She is getting a diagnostic breast US and mammogram on Friday 11/15   RED FLAGS: None   WEIGHT BEARING RESTRICTIONS: No  FALLS:  Has patient fallen in last 6 months? No  LIVING ENVIRONMENT: Lives with: lives with their son Lives in: House/apartment Stairs: Yes but she rarely goes up them   OCCUPATION: Works from home ; works in Oncologist  PLOF: Independent  PATIENT GOALS: To not feel like I am falling a part  NEXT MD VISIT:  PRN  OBJECTIVE:  Note: Objective measures were completed at Evaluation unless otherwise noted.  DIAGNOSTIC FINDINGS:  None  PATIENT SURVEYS:  NDI 20/50 40%  SCREENING FOR RED FLAGS: Bowel or bladder incontinence: No Spinal tumors: No Cauda equina syndrome: No Compression fracture: No Abdominal aneurysm: No  COGNITION: Overall cognitive status: Within functional limits for tasks assessed     SENSATION: Numbness and tingling ocassionaly in Lt arm  POSTURE: rounded shoulders and forward head  PALPATION: Increased muscle spasms of bilateral upper trap   Cervical ROM : AROM eval  Flexion 40 *pain coming up  Extension 36  Right lateral flexion 35  Left lateral flexion 40  Right rotation 60 *pain  Left rotation  60 *pain    LOWER EXTREMITY ROM:   WFL    FUNCTIONAL TESTS: NT  GAIT:  Comments: Unremarkable  TODAY'S TREATMENT:                                                                                                                              DATE:  05/14/2023: UBE 6 mins (3 forward/ 3 backward) - PT present to discuss status Standing flex and ABD 2# 2x10 ea Standing shrugs 2x10 2# Standing ER with retraction 2# 2x 10 3 way scapular stabilization x 10 ea with yellow loop both 4 D ball rolls with light blue plyo ball both Lat pull down x 20 with 45 lbs Matrix row x 20 with 25 lbs Prone shoulder extension,wide row and horizontal abduction x 20 with 2 lbs both  05/12/2023: UBE 6 mins (3 forward/ 3 backward) - PT present to discuss status Standing flex and ABD 2# 2x10 ea Standing shrugs 2x10 2# Standing ER with retraction 2# 2x 10 3 way scapular stabilization x 10 ea with yellow loop both Prone shoulder extension,wide row and horizontal abduction x 20 with 2 lbs both Skilled palpation and monitoring of soft tissues during DN. STM to B UT and cervical paraspinals.  Trigger Point Dry-Needling -  Treatment instructions: Expect mild to moderate muscle  soreness. S/S of pneumothorax if dry needled over a lung field, and to seek immediate medical attention should they occur. Patient verbalized understanding of these instructions and education. Patient Consent Given: Yes Education handout provided: Previously provided Muscles treated: Bilat upper trap, cervical multifidi Electrical stimulation performed: No Parameters: N/A Treatment response/outcome: Utilized skilled palpation to identify trigger points and bony landmarks.  Able to illicit twitch response and muscle elongation.  Manual soft tissue mobilization following to further promote tissue elongation.  05/05/2023: UBE 6 mins (3 forward/ 3 backward) - PT present to discuss status 3 way scapular stabilization x 10 ea with yellow loop both 4 D ball rolls with blue ball (couldn't find blue plyo ball) Prone shoulder extension,wide row and horizontal abduction x 20 with 2 lbs both Side lying ER with 2 lbs x 20 both Cervical Melt Method (flexion/ rotation) x15 Skilled palpation and monitoring of soft tissues during DN Trigger Point Dry-Needling - performed by Reather Laurence, PT Treatment instructions: Expect mild to moderate muscle soreness. S/S of pneumothorax if dry needled over a lung field, and to seek immediate medical attention should they occur. Patient verbalized understanding of these instructions and education. Patient Consent Given: Yes Education handout provided: Previously provided Muscles treated: Bilat upper trap, bilat levator, cervical multifidi, bilat rhomboids Electrical stimulation performed: No Parameters: N/A Treatment response/outcome: Utilized skilled palpation to identify trigger points and bony landmarks.  Able to illicit twitch response and muscle elongation.  Manual soft tissue mobilization following to further promote tissue elongation.  04/27/2023: Nustep L6x 5 min 3 way scapular stabilization x 10 ea with blue loop both 4 D ball rolls  with light blue plyo ball  both Prone shoulder extension,wide row and horizontal abduction x 20 with 2 lbs both Side lying ER with 2 lbs x 20 both Supine serratus punch with 2 lbs x 20 both Skilled palpation and monitoring of soft tissues during DN Trigger Point Dry-Needling  Treatment instructions: Expect mild to moderate muscle soreness. S/S of pneumothorax if dry needled over a lung field, and to seek immediate medical attention should they occur. Patient verbalized understanding of these instructions and education. Patient Consent Given: Yes Education handout provided: Previously provided Muscles treated: B UT, LS and cervical multifidi Electrical stimulation performed: No Parameters: N/A Treatment response/outcome: Twitch Response Elicited and Palpable Increase in Muscle Length    04/23/2023: Nustep x 5 min 3 way scapular stabilization with blue loop both 4 D ball rolls with light blue plyo ball both Lat pull down x 20 with 45 lbs Matrix row x 20 with 25 lbs Prone shoulder extension, row and horizontal abduction x 20 with 2 lbs both Side lying ER with 2 lbs x 20 both Supine serratus punch with 2 lbs x 20 both  04/20/2023:  Initial Eval & HEP created Trigger Point Dry-Needling - Performed by Lorrene Reid  Treatment instructions: Expect mild to moderate muscle soreness. S/S of pneumothorax if dry needled over a lung field, and to seek immediate medical attention should they occur. Patient verbalized understanding of these instructions and education.  Patient Consent Given: Yes Education handout provided: Yes Muscles treated: Lt cervical multifidi; Lt upper trap; Lt rhomboids  Electrical stimulation performed: No Parameters: N/A Treatment response/outcome: Utilized skilled palpation to identify trigger points.  During dry needling able to palpate muscle twitch and muscle elongation  PATIENT EDUCATION:  Education details: DN handout; POC; HEP Person educated: Patient Education method: Programmer, multimedia,  Demonstration, and Handouts Education comprehension: verbalized understanding, returned demonstration, and needs further education  HOME EXERCISE PROGRAM: Access Code: [email or web address] URL: [email or web address] Date: 04/23/2023 Prepared by: Mikey Kirschner  Exercises - Seated Upper Trapezius Stretch  - 1-2 x daily - 7 x weekly - 2 sets - 20-30 hold - Seated Levator Scapulae Stretch  - 1-2 x daily - 7 x weekly - 2 sets - 20-30 hold - Seated Scapular Retraction  - 1 x daily - 7 x weekly - 2 sets - 3 hold - Prone Shoulder Extension - Single Arm  - 2 x daily - 7 x weekly - 2 sets - 10 reps - Prone Shoulder Row  - 2 x daily - 7 x weekly - 2 sets - 10 reps - Prone Single Arm Shoulder Horizontal Abduction with Scapular Retraction and Palm Down  - 2 x daily - 7 x weekly - 2 sets - 10 reps - Sidelying Shoulder External Rotation  - 2 x daily - 7 x weekly - 2 sets - 10 reps - Single Arm Serratus Punches in Supine with Dumbbell  - 2 x daily - 7 x weekly - 2 sets - 10 reps - Shoulder Flexion Wall Slide with Resistance Band  - 1 x daily - 7 x weekly - 3 sets - 10 reps  ASSESSMENT:  CLINICAL IMPRESSION: ***  OBJECTIVE IMPAIRMENTS: decreased ROM, increased muscle spasms, postural dysfunction, and pain.   ACTIVITY LIMITATIONS: sitting, sleeping, and reach over head  PARTICIPATION LIMITATIONS: cleaning, driving, community activity, and occupation  PERSONAL FACTORS: Time since onset of injury/illness/exacerbation and 1-2 comorbidities: anxiety & depression  are also affecting patient's functional outcome.   REHAB POTENTIAL: Good  CLINICAL DECISION MAKING:  Stable/uncomplicated  EVALUATION COMPLEXITY: Low   GOALS: Goals reviewed with patient? Yes  SHORT TERM GOALS: Target date: 05/18/2023  Patient will be independent with initial HEP. Baseline:  Goal status: IN PROGRESS  2.  Patient will report > or = to 30% improvement in symptoms since starting PT. Baseline:  Goal status:  INITIAL  3.  Patient will report 30% improvement in sleep quality.  Baseline:  Goal status: IN PROGRESS  LONG TERM GOALS: Target date: 06/15/2023  Patient will demonstrate independence in advanced HEP. Baseline:  Goal status: INITIAL  2.  Patient will report > or = to 70% improvement in symptoms since starting PT. Baseline:  Goal status: INITIAL   3.  Patient will report > or = to 70% in sleep quality.  Baseline:  Goal status: INITIAL  4.  Patient will be able to reach over head with < or = to 2 / 10 pain. Baseline:  Goal status: INITIAL  5.  Patient will be able to begin workout regimen  Baseline:  Goal status: INITIAL    PLAN:  PT FREQUENCY: 2x/week  PT DURATION: 8 weeks  PLANNED INTERVENTIONS: 97164- PT Re-evaluation, 97110-Therapeutic exercises, 97530- Therapeutic activity, 97112- Neuromuscular re-education, 97535- Self Care, 12878- Manual therapy, [id number]- Canalith repositioning, [id number]- Aquatic Therapy, 97014- Electrical stimulation (unattended), [id number]- Electrical stimulation (manual), [id number]- Vasopneumatic device, [id number]- Ultrasound, [id number]- Traction (mechanical), [id number]- Ionotophoresis 4mg /ml Dexamethasone, Balance training, Stair training, Taping, Dry Needling, Joint mobilization, Joint manipulation, Spinal manipulation, Spinal mobilization, Vestibular training, Cryotherapy, and Moist heat.  PLAN FOR NEXT SESSION: continue postural strengthening ; FOTO; cervical mobility    Solon Palm, PT  05/13/23 11:25 AM [facility] [address] [place], [place] [address] Phone # [phone] Fax [phone]

## 2023-05-14 ENCOUNTER — Ambulatory Visit: Payer: Managed Care, Other (non HMO) | Admitting: Physical Therapy

## 2023-05-17 NOTE — Therapy (Signed)
OUTPATIENT PHYSICAL THERAPY THORACOLUMBAR / CERVICAL TREATMENT   Patient Name: Shari Peters MRN: [id number] DOB:1971-03-01, 52 y.o., female Today's Date: 05/18/2023  END OF SESSION:  PT End of Session - 05/18/23 0851     Visit Number 6    Date for PT Re-Evaluation 06/14/22    Authorization Type [employer]    Progress Note Due on Visit 10    PT Start Time [phone]    PT Stop Time 0929    PT Time Calculation (min) 38 min    Activity Tolerance Patient tolerated treatment well    Behavior During Therapy [facility] for tasks assessed/performed                 Past Medical History:  Diagnosis Date   Anxiety    Chronic left shoulder pain    Depression    History reviewed. No pertinent surgical history. Patient Active Problem List   Diagnosis Date Noted   Environmental and seasonal allergies 10/24/2015   Moderate episode of recurrent major depressive disorder (HCC) 10/24/2015    PCP: None  REFERRING PROVIDER: Julien Girt, PA-C  REFERRING DIAG: M54.50 (ICD-10-CM) - Low back pain, unspecified M54.12 (ICD-10-CM) - Radiculopathy, cervical region  Rationale for Evaluation and Treatment: Rehabilitation  THERAPY DIAG:  Cervicalgia  Other low back pain  Other muscle spasm  Abnormal posture  ONSET DATE: 6-7 months ago  SUBJECTIVE:                                                                                                                                                                                           SUBJECTIVE STATEMENT: 20-25% improvement overall. Doesn't last..  PERTINENT HISTORY:  Depression; anxiety; ADD  PAIN: 05/05/2023 Are you having pain? Yes: NPRS scale: 3-4/10 Pain location: Bilateral lower C-spine but left is more painful currently Pain description: dull & achy Aggravating factors: sitting; eaching overhead; sleeping; turning to look in blind spots. Relieving factors: nothing  PRECAUTIONS: Other: Patient has an unspecified lump on her  Lt side under her arm. She is getting a diagnostic breast US and mammogram on Friday 11/15   RED FLAGS: None   WEIGHT BEARING RESTRICTIONS: No  FALLS:  Has patient fallen in last 6 months? No  LIVING ENVIRONMENT: Lives with: lives with their son Lives in: House/apartment Stairs: Yes but she rarely goes up them   OCCUPATION: Works from home ; works in [employer]  PLOF: Independent  PATIENT GOALS: To not feel like I am falling a part  NEXT MD VISIT: PRN  OBJECTIVE:  Note: Objective measures were completed at Evaluation unless otherwise noted.  DIAGNOSTIC FINDINGS:  None  PATIENT  SURVEYS:  NDI 20/50 40%  SCREENING FOR RED FLAGS: Bowel or bladder incontinence: No Spinal tumors: No Cauda equina syndrome: No Compression fracture: No Abdominal aneurysm: No  COGNITION: Overall cognitive status: Within functional limits for tasks assessed     SENSATION: Numbness and tingling ocassionaly in Lt arm  POSTURE: rounded shoulders and forward head  PALPATION: Increased muscle spasms of bilateral upper trap   Cervical ROM : AROM eval  Flexion 40 *pain coming up  Extension 36  Right lateral flexion 35  Left lateral flexion 40  Right rotation 60 *pain  Left rotation         60 *pain    LOWER EXTREMITY ROM:   WFL    FUNCTIONAL TESTS: NT  GAIT:  Comments: Unremarkable  TODAY'S TREATMENT:                                                                                                                              DATE:   05/18/2023: Lat pull down x 20 with 40 lbs Matrix row x 20 with 25 lbs Thoracic extension on foam roller horizontal and vertical, with prolonged pec stretch and ant shoulder stretch in vertical position Standing L shoulder ER x 20 Red Standing horizontal ABD x 20 Red Supine serratus push with Red x 20 Skilled palpation and monitoring of soft tissues during DN.  Trigger Point Dry-Needling -  Treatment instructions: Expect mild to moderate  muscle soreness. S/S of pneumothorax if dry needled over a lung field, and to seek immediate medical attention should they occur. Patient verbalized understanding of these instructions and education. Patient Consent Given: Yes Education handout provided: Previously provided Muscles treated: L UT, pectoralis and deltoid Electrical stimulation performed: No Parameters: N/A Treatment response/outcome: Utilized skilled palpation to identify trigger points and bony landmarks.  Able to illicit twitch response and muscle elongation.  Manual soft tissue mobilization following to further promote tissue elongation.  05/12/2023: UBE 6 mins (3 forward/ 3 backward) - PT present to discuss status Standing flex and ABD 2# 2x10 ea Standing shrugs 2x10 2# Standing ER with retraction 2# 2x 10 3 way scapular stabilization x 10 ea with yellow loop both Prone shoulder extension,wide row and horizontal abduction x 20 with 2 lbs both Skilled palpation and monitoring of soft tissues during DN. STM to B UT and cervical paraspinals.  Trigger Point Dry-Needling -  Treatment instructions: Expect mild to moderate muscle soreness. S/S of pneumothorax if dry needled over a lung field, and to seek immediate medical attention should they occur. Patient verbalized understanding of these instructions and education. Patient Consent Given: Yes Education handout provided: Previously provided Muscles treated: Bilat upper trap, cervical multifidi Electrical stimulation performed: No Parameters: N/A Treatment response/outcome: Utilized skilled palpation to identify trigger points and bony landmarks.  Able to illicit twitch response and muscle elongation.  Manual soft tissue mobilization following to further promote tissue elongation.  05/05/2023: UBE 6 mins (3 forward/ 3 backward) -  PT present to discuss status 3 way scapular stabilization x 10 ea with yellow loop both 4 D ball rolls with blue ball (couldn't find blue plyo  ball) Prone shoulder extension,wide row and horizontal abduction x 20 with 2 lbs both Side lying ER with 2 lbs x 20 both Cervical Melt Method (flexion/ rotation) x15 Skilled palpation and monitoring of soft tissues during DN Trigger Point Dry-Needling - performed by Reather Laurence, PT Treatment instructions: Expect mild to moderate muscle soreness. S/S of pneumothorax if dry needled over a lung field, and to seek immediate medical attention should they occur. Patient verbalized understanding of these instructions and education. Patient Consent Given: Yes Education handout provided: Previously provided Muscles treated: Bilat upper trap, bilat levator, cervical multifidi, bilat rhomboids Electrical stimulation performed: No Parameters: N/A Treatment response/outcome: Utilized skilled palpation to identify trigger points and bony landmarks.  Able to illicit twitch response and muscle elongation.  Manual soft tissue mobilization following to further promote tissue elongation.  04/27/2023: Nustep L6x 5 min 3 way scapular stabilization x 10 ea with blue loop both 4 D ball rolls with light blue plyo ball both Prone shoulder extension,wide row and horizontal abduction x 20 with 2 lbs both Side lying ER with 2 lbs x 20 both Supine serratus punch with 2 lbs x 20 both Skilled palpation and monitoring of soft tissues during DN Trigger Point Dry-Needling  Treatment instructions: Expect mild to moderate muscle soreness. S/S of pneumothorax if dry needled over a lung field, and to seek immediate medical attention should they occur. Patient verbalized understanding of these instructions and education. Patient Consent Given: Yes Education handout provided: Previously provided Muscles treated: B UT, LS and cervical multifidi  Electrical stimulation performed: No Parameters: N/A Treatment response/outcome: Twitch Response Elicited and Palpable Increase in Muscle Length   PATIENT EDUCATION:  Education  details: DN handout; POC; HEP Person educated: Patient Education method: Explanation, Demonstration, and Handouts Education comprehension: verbalized understanding, returned demonstration, and needs further education  HOME EXERCISE PROGRAM: Access Code: [email or web address] URL: [email or web address] Date: 05/18/2023 Prepared by: Raynelle Fanning  Exercises - Seated Upper Trapezius Stretch  - 1-2 x daily - 7 x weekly - 2 sets - 20-30 hold - Seated Levator Scapulae Stretch  - 1-2 x daily - 7 x weekly - 2 sets - 20-30 hold - Seated Scapular Retraction  - 1 x daily - 7 x weekly - 2 sets - 3 hold - Prone Shoulder Extension - Single Arm  - 2 x daily - 7 x weekly - 2 sets - 10 reps - Prone Shoulder Row  - 2 x daily - 7 x weekly - 2 sets - 10 reps - Prone Single Arm Shoulder Horizontal Abduction with Scapular Retraction and Palm Down  - 2 x daily - 7 x weekly - 2 sets - 10 reps - Sidelying Shoulder External Rotation  - 2 x daily - 7 x weekly - 2 sets - 10 reps - Single Arm Serratus Punches in Supine with Dumbbell  - 2 x daily - 7 x weekly - 2 sets - 10 reps - Shoulder Flexion Wall Slide with Resistance Band  - 1 x daily - 7 x weekly - 3 sets - 10 reps - Shoulder External Rotation with Anchored Resistance  - 1 x daily - 3-4 x weekly - 1 sets - 10 reps - Standing Shoulder Horizontal Abduction with Resistance  - 1 x daily - 3-4 x weekly - 1 sets - 10 reps - Supine Serratus Punches Resistance  -  1 x daily - 3-4 x weekly - 1 sets - 10 reps  ASSESSMENT:  CLINICAL IMPRESSION: Eunice Blase reports only temporary relief with DN. She indicates more pain in the L UT and pectoralis region today and states that her pain goes to the elbow when it hurts. She demonstrates full cervical rotation and sidebending B with mild tightness with R SB. She has not been compliant with many of her exercises due to not having hand weights, so band exercises were issued today. Good twitch responses with DN again today. Continue to f/u with  patient regarding HEP compliance.   OBJECTIVE IMPAIRMENTS: decreased ROM, increased muscle spasms, postural dysfunction, and pain.   ACTIVITY LIMITATIONS: sitting, sleeping, and reach over head  PARTICIPATION LIMITATIONS: cleaning, driving, community activity, and occupation  PERSONAL FACTORS: Time since onset of injury/illness/exacerbation and 1-2 comorbidities: anxiety & depression  are also affecting patient's functional outcome.   REHAB POTENTIAL: Good  CLINICAL DECISION MAKING: Stable/uncomplicated  EVALUATION COMPLEXITY: Low   GOALS: Goals reviewed with patient? Yes  SHORT TERM GOALS: Target date: 05/18/2023  Patient will be independent with initial HEP. Baseline:  Goal status: IN PROGRESS  2.  Patient will report > or = to 30% improvement in symptoms since starting PT. Baseline:  Goal status: IN PROGRESS 12/9 20-25%  3.  Patient will report 30% improvement in sleep quality.  Baseline:  Goal status: IN PROGRESS 25% 12/9  LONG TERM GOALS: Target date: 06/15/2023  Patient will demonstrate independence in advanced HEP. Baseline:  Goal status: INITIAL  2.  Patient will report > or = to 70% improvement in symptoms since starting PT. Baseline:  Goal status: INITIAL   3.  Patient will report > or = to 70% in sleep quality.  Baseline:  Goal status: INITIAL  4.  Patient will be able to reach over head with < or = to 2 / 10 pain. Baseline:  Goal status: INITIAL  5.  Patient will be able to begin workout regimen  Baseline:  Goal status: INITIAL    PLAN:  PT FREQUENCY: 2x/week  PT DURATION: 8 weeks  PLANNED INTERVENTIONS: 97164- PT Re-evaluation, 97110-Therapeutic exercises, 97530- Therapeutic activity, 97112- Neuromuscular re-education, 97535- Self Care, 32440- Manual therapy, [id number]- Canalith repositioning, [id number]- Aquatic Therapy, 97014- Electrical stimulation (unattended), [id number]- Electrical stimulation (manual), [id number]- Vasopneumatic device, [id number]- Ultrasound,  [id number]- Traction (mechanical), [id number]- Ionotophoresis 4mg /ml Dexamethasone, Balance training, Stair training, Taping, Dry Needling, Joint mobilization, Joint manipulation, Spinal manipulation, Spinal mobilization, Vestibular training, Cryotherapy, and Moist heat.  PLAN FOR NEXT SESSION: continue postural strengthening, left shoulder strength,  cervical mobility    Solon Palm, PT  05/18/23 1:33 PM [facility] [address] [place], [place] [address] Phone # [phone] Fax [phone]

## 2023-05-18 ENCOUNTER — Ambulatory Visit: Payer: Managed Care, Other (non HMO) | Admitting: Physical Therapy

## 2023-05-18 ENCOUNTER — Encounter: Payer: Self-pay | Admitting: Physical Therapy

## 2023-05-18 DIAGNOSIS — R293 Abnormal posture: Secondary | ICD-10-CM

## 2023-05-18 DIAGNOSIS — M542 Cervicalgia: Secondary | ICD-10-CM | POA: Diagnosis not present

## 2023-05-18 DIAGNOSIS — M5459 Other low back pain: Secondary | ICD-10-CM

## 2023-05-18 DIAGNOSIS — M62838 Other muscle spasm: Secondary | ICD-10-CM

## 2023-05-20 NOTE — Therapy (Signed)
OUTPATIENT PHYSICAL THERAPY THORACOLUMBAR / CERVICAL TREATMENT   Patient Name: Shari Peters MRN: [id number] DOB:August 06, 1970, 52 y.o., female Today's Date: 05/21/2023  END OF SESSION:  PT End of Session - 05/21/23 0848     Visit Number 7    Date for PT Re-Evaluation 06/14/22    Authorization Type [employer]    Progress Note Due on Visit 10    PT Start Time 0848    PT Stop Time 0928    PT Time Calculation (min) 40 min    Activity Tolerance Patient tolerated treatment well    Behavior During Therapy [facility] for tasks assessed/performed                  Past Medical History:  Diagnosis Date   Anxiety    Chronic left shoulder pain    Depression    History reviewed. No pertinent surgical history. Patient Active Problem List   Diagnosis Date Noted   Environmental and seasonal allergies 10/24/2015   Moderate episode of recurrent major depressive disorder (HCC) 10/24/2015    PCP: None  REFERRING PROVIDER: Julien Girt, PA-C  REFERRING DIAG: M54.50 (ICD-10-CM) - Low back pain, unspecified M54.12 (ICD-10-CM) - Radiculopathy, cervical region  Rationale for Evaluation and Treatment: Rehabilitation  THERAPY DIAG:  Cervicalgia  Other low back pain  Other muscle spasm  Abnormal posture  ONSET DATE: 6-7 months ago  SUBJECTIVE:                                                                                                                                                                                           SUBJECTIVE STATEMENT: I'm doing the exercises and now I'm tighter and uncomfortable. Tight across rhomboids area. S  PERTINENT HISTORY:  Depression; anxiety; ADD  PAIN: 05/05/2023 Are you having pain? Yes: NPRS scale: 3-4/10 Pain location: Bilateral lower C-spine but left is more painful currently Pain description: dull & achy Aggravating factors: sitting; eaching overhead; sleeping; turning to look in blind spots. Relieving factors:  nothing  PRECAUTIONS: Other: Patient has an unspecified lump on her Lt side under her arm. She is getting a diagnostic breast US and mammogram on Friday 11/15   RED FLAGS: None   WEIGHT BEARING RESTRICTIONS: No  FALLS:  Has patient fallen in last 6 months? No  LIVING ENVIRONMENT: Lives with: lives with their son Lives in: House/apartment Stairs: Yes but she rarely goes up them   OCCUPATION: Works from home ; works in Oncologist  PLOF: Independent  PATIENT GOALS: To not feel like I am falling a part  NEXT MD VISIT: PRN  OBJECTIVE:  Note: Objective measures were completed at  Evaluation unless otherwise noted.  DIAGNOSTIC FINDINGS:  None  PATIENT SURVEYS:  NDI 20/50 40%  SCREENING FOR RED FLAGS: Bowel or bladder incontinence: No Spinal tumors: No Cauda equina syndrome: No Compression fracture: No Abdominal aneurysm: No  COGNITION: Overall cognitive status: Within functional limits for tasks assessed     SENSATION: Numbness and tingling ocassionaly in Lt arm  POSTURE: rounded shoulders and forward head  PALPATION: Increased muscle spasms of bilateral upper trap   Cervical ROM : AROM eval  Flexion 40 *pain coming up  Extension 36  Right lateral flexion 35  Left lateral flexion 40  Right rotation 60 *pain  Left rotation         60 *pain    LOWER EXTREMITY ROM:   WFL    FUNCTIONAL TESTS: NT  GAIT:  Comments: Unremarkable  TODAY'S TREATMENT:                                                                                                                              DATE:  05/21/2023: Standing scapular sets facing wall Y position x 20 3 way scapular stabilization with yellow loop both x 10, then with yellow x 5 Wall clock taps with blue plyo ball x 5 (down and up = 1) B Lat pull down x 20 with 40 lbs Matrix row x 20 with 25 lbs Lawn mower at [employer] 10# 2x 10 B Standing L shoulder ER x 20 Red Standing horizontal ABD x 20 Red Supine serratus push  with Red x 10, changed to unilateral 5# wt x 20 ea Thread the needle to [facility] reach (thoracic rotation) x 5 ea   05/18/2023: Lat pull down x 20 with 40 lbs Matrix row x 20 with 25 lbs Thoracic extension on foam roller horizontal and vertical, with prolonged pec stretch and ant shoulder stretch in vertical position Standing L shoulder ER x 20 Red Standing horizontal ABD x 20 Red Supine serratus push with Red x 20 Skilled palpation and monitoring of soft tissues during DN.  Trigger Point Dry-Needling -  Treatment instructions: Expect mild to moderate muscle soreness. S/S of pneumothorax if dry needled over a lung field, and to seek immediate medical attention should they occur. Patient verbalized understanding of these instructions and education. Patient Consent Given: Yes Education handout provided: Previously provided Muscles treated: L UT, pectoralis and deltoid Electrical stimulation performed: No Parameters: N/A Treatment response/outcome: Utilized skilled palpation to identify trigger points and bony landmarks.  Able to illicit twitch response and muscle elongation.  Manual soft tissue mobilization following to further promote tissue elongation.  05/12/2023: UBE 6 mins (3 forward/ 3 backward) - PT present to discuss status Standing flex and ABD 2# 2x10 ea Standing shrugs 2x10 2# Standing ER with retraction 2# 2x 10 3 way scapular stabilization x 10 ea with yellow loop both Prone shoulder extension,wide row and horizontal abduction x 20 with 2 lbs both Skilled palpation and monitoring of soft tissues  during DN. STM to B UT and cervical paraspinals.  Trigger Point Dry-Needling -  Treatment instructions: Expect mild to moderate muscle soreness. S/S of pneumothorax if dry needled over a lung field, and to seek immediate medical attention should they occur. Patient verbalized understanding of these instructions and education. Patient Consent Given: Yes Education handout provided: Previously  provided Muscles treated: Bilat upper trap, cervical multifidi Electrical stimulation performed: No Parameters: N/A Treatment response/outcome: Utilized skilled palpation to identify trigger points and bony landmarks.  Able to illicit twitch response and muscle elongation.  Manual soft tissue mobilization following to further promote tissue elongation.  05/05/2023: UBE 6 mins (3 forward/ 3 backward) - PT present to discuss status 3 way scapular stabilization x 10 ea with yellow loop both 4 D ball rolls with blue ball (couldn't find blue plyo ball) Prone shoulder extension,wide row and horizontal abduction x 20 with 2 lbs both Side lying ER with 2 lbs x 20 both Cervical Melt Method (flexion/ rotation) x15 Skilled palpation and monitoring of soft tissues during DN Trigger Point Dry-Needling - performed by Reather Laurence, PT Treatment instructions: Expect mild to moderate muscle soreness. S/S of pneumothorax if dry needled over a lung field, and to seek immediate medical attention should they occur. Patient verbalized understanding of these instructions and education. Patient Consent Given: Yes Education handout provided: Previously provided Muscles treated: Bilat upper trap, bilat levator, cervical multifidi, bilat rhomboids Electrical stimulation performed: No Parameters: N/A Treatment response/outcome: Utilized skilled palpation to identify trigger points and bony landmarks.  Able to illicit twitch response and muscle elongation.  Manual soft tissue mobilization following to further promote tissue elongation.  04/27/2023: Nustep L6x 5 min 3 way scapular stabilization x 10 ea with blue loop both 4 D ball rolls with light blue plyo ball both Prone shoulder extension,wide row and horizontal abduction x 20 with 2 lbs both Side lying ER with 2 lbs x 20 both Supine serratus punch with 2 lbs x 20 both Skilled palpation and monitoring of soft tissues during DN Trigger Point Dry-Needling   Treatment instructions: Expect mild to moderate muscle soreness. S/S of pneumothorax if dry needled over a lung field, and to seek immediate medical attention should they occur. Patient verbalized understanding of these instructions and education. Patient Consent Given: Yes Education handout provided: Previously provided Muscles treated: B UT, LS and cervical multifidi  Electrical stimulation performed: No Parameters: N/A Treatment response/outcome: Twitch Response Elicited and Palpable Increase in Muscle Length   PATIENT EDUCATION:  Education details: DN handout; POC; HEP Person educated: Patient Education method: Explanation, Demonstration, and Handouts Education comprehension: verbalized understanding, returned demonstration, and needs further education  HOME EXERCISE PROGRAM: Access Code: [email or web address] URL: [email or web address] Date: 05/21/2023 Prepared by: Raynelle Fanning  Exercises - Seated Upper Trapezius Stretch  - 1-2 x daily - 7 x weekly - 2 sets - 20-30 hold - Seated Levator Scapulae Stretch  - 1-2 x daily - 7 x weekly - 2 sets - 20-30 hold - Seated Scapular Retraction  - 1 x daily - 7 x weekly - 2 sets - 3 hold - Prone Shoulder Extension - Single Arm  - 2 x daily - 7 x weekly - 2 sets - 10 reps - Prone Shoulder Row  - 2 x daily - 7 x weekly - 2 sets - 10 reps - Prone Single Arm Shoulder Horizontal Abduction with Scapular Retraction and Palm Down  - 2 x daily - 7 x weekly - 2 sets - 10 reps - Sidelying  Shoulder External Rotation  - 2 x daily - 7 x weekly - 2 sets - 10 reps - Single Arm Serratus Punches in Supine with Dumbbell  - 2 x daily - 7 x weekly - 2 sets - 10 reps - Shoulder Flexion Wall Slide with Resistance Band  - 1 x daily - 7 x weekly - 3 sets - 10 reps - Shoulder External Rotation with Anchored Resistance  - 1 x daily - 3-4 x weekly - 1 sets - 10 reps - Standing Shoulder Horizontal Abduction with Resistance  - 1 x daily - 3-4 x weekly - 1 sets - 10 reps -  Supine Serratus Punches Resistance  - 1 x daily - 3-4 x weekly - 1 sets - 10 reps - Quadruped Full Range Thoracic Rotation with Reach  - 2 x daily - 7 x weekly - 2 sets - 10 reps - Seated Thoracic Extension Arms Overhead  - 2 x daily - 7 x weekly - 2 sets - 10 reps  ASSESSMENT:  CLINICAL IMPRESSION: Reviewed HEP as she was having some trouble with band exercises. Modified serratus push back to supine with weight. Focused on  post shoulder and scapular strengthening today as well as thoracic mobility all of which are challenging. Patient downloaded MedBridgeGo app to make exercises more organized for her. She is mainly reporting tightness across upper and mid thoracic spine today. Not pain.  OBJECTIVE IMPAIRMENTS: decreased ROM, increased muscle spasms, postural dysfunction, and pain.   ACTIVITY LIMITATIONS: sitting, sleeping, and reach over head  PARTICIPATION LIMITATIONS: cleaning, driving, community activity, and occupation  PERSONAL FACTORS: Time since onset of injury/illness/exacerbation and 1-2 comorbidities: anxiety & depression  are also affecting patient's functional outcome.   REHAB POTENTIAL: Good  CLINICAL DECISION MAKING: Stable/uncomplicated  EVALUATION COMPLEXITY: Low   GOALS: Goals reviewed with patient? Yes  SHORT TERM GOALS: Target date: 05/18/2023  Patient will be independent with initial HEP. Baseline:  Goal status: IN PROGRESS  2.  Patient will report > or = to 30% improvement in symptoms since starting PT. Baseline:  Goal status: IN PROGRESS 12/9 20-25%  3.  Patient will report 30% improvement in sleep quality.  Baseline:  Goal status: IN PROGRESS 25% 12/9  LONG TERM GOALS: Target date: 06/15/2023  Patient will demonstrate independence in advanced HEP. Baseline:  Goal status: INITIAL  2.  Patient will report > or = to 70% improvement in symptoms since starting PT. Baseline:  Goal status: INITIAL   3.  Patient will report > or = to 70% in sleep  quality.  Baseline:  Goal status: INITIAL  4.  Patient will be able to reach over head with < or = to 2 / 10 pain. Baseline:  Goal status: INITIAL  5.  Patient will be able to begin workout regimen  Baseline:  Goal status: INITIAL    PLAN:  PT FREQUENCY: 2x/week  PT DURATION: 8 weeks  PLANNED INTERVENTIONS: 97164- PT Re-evaluation, 97110-Therapeutic exercises, 97530- Therapeutic activity, 97112- Neuromuscular re-education, 97535- Self Care, 96295- Manual therapy, [id number]- Canalith repositioning, [id number]- Aquatic Therapy, 97014- Electrical stimulation (unattended), [id number]- Electrical stimulation (manual), [id number]- Vasopneumatic device, [id number]- Ultrasound, [id number]- Traction (mechanical), [id number]- Ionotophoresis 4mg /ml Dexamethasone, Balance training, Stair training, Taping, Dry Needling, Joint mobilization, Joint manipulation, Spinal manipulation, Spinal mobilization, Vestibular training, Cryotherapy, and Moist heat.  PLAN FOR NEXT SESSION: continue postural strengthening, left shoulder strength,  cervical mobility    Solon Palm, PT  05/21/23 9:32 AM [facility] [address], [address] [place], [place] [address] Phone # [phone] Fax [phone]

## 2023-05-21 ENCOUNTER — Encounter: Payer: Self-pay | Admitting: Physical Therapy

## 2023-05-21 ENCOUNTER — Ambulatory Visit: Payer: Managed Care, Other (non HMO) | Admitting: Physical Therapy

## 2023-05-21 DIAGNOSIS — M62838 Other muscle spasm: Secondary | ICD-10-CM

## 2023-05-21 DIAGNOSIS — R293 Abnormal posture: Secondary | ICD-10-CM

## 2023-05-21 DIAGNOSIS — M542 Cervicalgia: Secondary | ICD-10-CM

## 2023-05-21 DIAGNOSIS — M5459 Other low back pain: Secondary | ICD-10-CM

## 2023-05-27 ENCOUNTER — Ambulatory Visit: Payer: Managed Care, Other (non HMO)

## 2023-05-27 DIAGNOSIS — M5459 Other low back pain: Secondary | ICD-10-CM

## 2023-05-27 DIAGNOSIS — M542 Cervicalgia: Secondary | ICD-10-CM

## 2023-05-27 DIAGNOSIS — R293 Abnormal posture: Secondary | ICD-10-CM

## 2023-05-27 DIAGNOSIS — M62838 Other muscle spasm: Secondary | ICD-10-CM

## 2023-05-27 DIAGNOSIS — M6281 Muscle weakness (generalized): Secondary | ICD-10-CM

## 2023-05-27 NOTE — Therapy (Signed)
OUTPATIENT PHYSICAL THERAPY THORACOLUMBAR / CERVICAL TREATMENT   Patient Name: Shari Peters MRN: [id number] DOB:[birth year], 52 y.o., female Today's Date: 05/27/2023  END OF SESSION:  PT End of Session - 05/27/23 1019     Visit Number 8    Date for PT Re-Evaluation 06/14/22    Authorization Type [employer]    Progress Note Due on Visit 10    PT Start Time 1017    PT Stop Time 1104    PT Time Calculation (min) 47 min    Activity Tolerance Patient tolerated treatment well    Behavior During Therapy WFL for tasks assessed/performed                   Past Medical History:  Diagnosis Date   Anxiety    Chronic left shoulder pain    Depression    History reviewed. No pertinent surgical history. Patient Active Problem List   Diagnosis Date Noted   Environmental and seasonal allergies 10/24/2015   Moderate episode of recurrent major depressive disorder (HCC) 10/24/2015    PCP: None  REFERRING PROVIDER: Julien Girt, PA-C  REFERRING DIAG: M54.50 (ICD-10-CM) - Low back pain, unspecified M54.12 (ICD-10-CM) - Radiculopathy, cervical region  Rationale for Evaluation and Treatment: Rehabilitation  THERAPY DIAG:  Cervicalgia  Other low back pain  Other muscle spasm  Abnormal posture  Muscle weakness (generalized)  ONSET DATE: 6-7 months ago  SUBJECTIVE:                                                                                                                                                                                           SUBJECTIVE STATEMENT: I feel better for a couple of days after DN but it doesn't last.  I am wondering if something is going on with my Lt shoulder.   PERTINENT HISTORY:  Depression; anxiety; ADD  PAIN: 05/27/23 Are you having pain? Yes: NPRS scale: 4/10 Pain location: Lt arm/shoulder Pain description: dull & achy Aggravating factors: sitting; eaching overhead; sleeping; turning to look in blind spots. Relieving  factors: nothing  PRECAUTIONS: Other: Patient has an unspecified lump on her Lt side under her arm. She is getting a diagnostic breast US and mammogram on Friday 11/15   RED FLAGS: None   WEIGHT BEARING RESTRICTIONS: No  FALLS:  Has patient fallen in last 6 months? No  LIVING ENVIRONMENT: Lives with: lives with their son Lives in: House/apartment Stairs: Yes but she rarely goes up them   OCCUPATION: Works from home ; works in Oncologist  PLOF: Independent  PATIENT GOALS: To not feel like I am falling a part  NEXT MD  VISIT: PRN  OBJECTIVE:  Note: Objective measures were completed at Evaluation unless otherwise noted.  DIAGNOSTIC FINDINGS:  None  PATIENT SURVEYS:  NDI 20/50 40%  SCREENING FOR RED FLAGS: Bowel or bladder incontinence: No Spinal tumors: No Cauda equina syndrome: No Compression fracture: No Abdominal aneurysm: No  COGNITION: Overall cognitive status: Within functional limits for tasks assessed     SENSATION: Numbness and tingling ocassionaly in Lt arm  POSTURE: rounded shoulders and forward head  PALPATION: Increased muscle spasms of bilateral upper trap   Cervical ROM : AROM eval 05/27/23  Flexion 40 *pain coming up   Extension 36   Right lateral flexion 35 48  Left lateral flexion 40 45  Right rotation 60 *pain 60  Left rotation         60 *pain 65    LOWER EXTREMITY ROM:   WFL  FUNCTIONAL TESTS: NT  GAIT:  Comments: Unremarkable  TODAY'S TREATMENT:                                                                                                                              DATE:  05/27/2023: Arm bike: level 1.5 x6 minutes (3/3)-PT present to discuss progress  Cervical A/ROM- 3 ways- measures taken after  Trigger Point Dry-Needling -  Treatment instructions: Expect mild to moderate muscle soreness. S/S of pneumothorax if dry needled over a lung field, and to seek immediate medical attention should they occur. Patient verbalized  understanding of these instructions and education. Patient Consent Given: Yes Education handout provided: Previously provided Muscles treated: bil cervical multifidi, Lt rhomboids and deltoid Treatment response/outcome: Utilized skilled palpation to identify trigger points and bony landmarks.  Able to illicit twitch response and muscle elongation.  Manual soft tissue mobilization following to further promote tissue elongation. Cervical mechanical traction: 15#/5# x 10 minutes (60 seconds/10 seconds) 05/21/2023: Standing scapular sets facing wall Y position x 20 3 way scapular stabilization with yellow loop both x 10, then with yellow x 5 Wall clock taps with blue plyo ball x 5 (down and up = 1) B Lat pull down x 20 with 40 lbs Matrix row x 20 with 25 lbs Lawn mower at [employer] 10# 2x 10 B Standing L shoulder ER x 20 Red Standing horizontal ABD x 20 Red Supine serratus push with Red x 10, changed to unilateral 5# wt x 20 ea Thread the needle to [facility] reach (thoracic rotation) x 5 ea   05/18/2023: Lat pull down x 20 with 40 lbs Matrix row x 20 with 25 lbs Thoracic extension on foam roller horizontal and vertical, with prolonged pec stretch and ant shoulder stretch in vertical position Standing L shoulder ER x 20 Red Standing horizontal ABD x 20 Red Supine serratus push with Red x 20 Skilled palpation and monitoring of soft tissues during DN.  Trigger Point Dry-Needling -  Treatment instructions: Expect mild to moderate muscle soreness. S/S of pneumothorax if dry needled over a  lung field, and to seek immediate medical attention should they occur. Patient verbalized understanding of these instructions and education. Patient Consent Given: Yes Education handout provided: Previously provided Muscles treated: L UT, pectoralis and deltoid Electrical stimulation performed: No Parameters: N/A Treatment response/outcome: Utilized skilled palpation to identify trigger points and bony landmarks.   Able to illicit twitch response and muscle elongation.  Manual soft tissue mobilization following to further promote tissue elongation.  PATIENT EDUCATION:  Education details: DN handout; POC; HEP Person educated: Patient Education method: Programmer, multimedia, Demonstration, and Handouts Education comprehension: verbalized understanding, returned demonstration, and needs further education  HOME EXERCISE PROGRAM: Access Code: [email or web address] URL: [email or web address] Date: 05/21/2023 Prepared by: Raynelle Fanning  Exercises - Seated Upper Trapezius Stretch  - 1-2 x daily - 7 x weekly - 2 sets - 20-30 hold - Seated Levator Scapulae Stretch  - 1-2 x daily - 7 x weekly - 2 sets - 20-30 hold - Seated Scapular Retraction  - 1 x daily - 7 x weekly - 2 sets - 3 hold - Prone Shoulder Extension - Single Arm  - 2 x daily - 7 x weekly - 2 sets - 10 reps - Prone Shoulder Row  - 2 x daily - 7 x weekly - 2 sets - 10 reps - Prone Single Arm Shoulder Horizontal Abduction with Scapular Retraction and Palm Down  - 2 x daily - 7 x weekly - 2 sets - 10 reps - Sidelying Shoulder External Rotation  - 2 x daily - 7 x weekly - 2 sets - 10 reps - Single Arm Serratus Punches in Supine with Dumbbell  - 2 x daily - 7 x weekly - 2 sets - 10 reps - Shoulder Flexion Wall Slide with Resistance Band  - 1 x daily - 7 x weekly - 3 sets - 10 reps - Shoulder External Rotation with Anchored Resistance  - 1 x daily - 3-4 x weekly - 1 sets - 10 reps - Standing Shoulder Horizontal Abduction with Resistance  - 1 x daily - 3-4 x weekly - 1 sets - 10 reps - Supine Serratus Punches Resistance  - 1 x daily - 3-4 x weekly - 1 sets - 10 reps - Quadruped Full Range Thoracic Rotation with Reach  - 2 x daily - 7 x weekly - 2 sets - 10 reps - Seated Thoracic Extension Arms Overhead  - 2 x daily - 7 x weekly - 2 sets - 10 reps  ASSESSMENT:  CLINICAL IMPRESSION: Pt arrives with consistent complaint of Lt arm and scapular pain.  She has pain  reduction after treatment and this pain returns within a few days.  PT suggested that pt reach out to her PA to discuss limited progress.  Trial today of cervical traction to determine impact on radiculopathy. Pt with multiple twitch responses with DN and improved mobility after DN.  Patient will benefit from skilled PT to address the below impairments and improve overall function.   OBJECTIVE IMPAIRMENTS: decreased ROM, increased muscle spasms, postural dysfunction, and pain.   ACTIVITY LIMITATIONS: sitting, sleeping, and reach over head  PARTICIPATION LIMITATIONS: cleaning, driving, community activity, and occupation  PERSONAL FACTORS: Time since onset of injury/illness/exacerbation and 1-2 comorbidities: anxiety & depression  are also affecting patient's functional outcome.   REHAB POTENTIAL: Good  CLINICAL DECISION MAKING: Stable/uncomplicated  EVALUATION COMPLEXITY: Low   GOALS: Goals reviewed with patient? Yes  SHORT TERM GOALS: Target date: 05/18/2023  Patient will be independent with initial HEP. Baseline:  Goal  status: MET  2.  Patient will report > or = to 30% improvement in symptoms since starting PT. Baseline:  Goal status: IN PROGRESS 12/9 20-25%  3.  Patient will report 30% improvement in sleep quality.  Baseline:  Goal status: IN PROGRESS 25% 12/9  LONG TERM GOALS: Target date: 06/15/2023  Patient will demonstrate independence in advanced HEP. Baseline:  Goal status: INITIAL  2.  Patient will report > or = to 70% improvement in symptoms since starting PT. Baseline:  Goal status: INITIAL   3.  Patient will report > or = to 70% in sleep quality.  Baseline: pain does not limit this, sleep exacerbates pain-wake up more stiff (05/27/23) Goal status: INITIAL  4.  Patient will be able to reach over head with < or = to 2 / 10 pain. Baseline:  Goal status: INITIAL  5.  Patient will be able to begin workout regimen  Baseline:  Goal status:  INITIAL    PLAN:  PT FREQUENCY: 2x/week  PT DURATION: 8 weeks  PLANNED INTERVENTIONS: 97164- PT Re-evaluation, 97110-Therapeutic exercises, 97530- Therapeutic activity, 97112- Neuromuscular re-education, 97535- Self Care, 82956- Manual therapy, [id number]- Canalith repositioning, [id number]- Aquatic Therapy, 97014- Electrical stimulation (unattended), [id number]- Electrical stimulation (manual), [id number]- Vasopneumatic device, [id number]- Ultrasound, [id number]- Traction (mechanical), [id number]- Ionotophoresis 4mg /ml Dexamethasone, Balance training, Stair training, Taping, Dry Needling, Joint mobilization, Joint manipulation, Spinal manipulation, Spinal mobilization, Vestibular training, Cryotherapy, and Moist heat.  PLAN FOR NEXT SESSION: continue postural strengthening, left shoulder strength,  cervical mobility, see how she did with traction  Lorrene Reid, PT 05/27/23 11:02 AM  [facility] [address] [place], [place] [address] Phone # [phone] Fax [phone]

## 2023-06-11 NOTE — Therapy (Signed)
 OUTPATIENT PHYSICAL THERAPY THORACOLUMBAR / CERVICAL TREATMENT AND RECERTIFICATION   Patient Name: Shari Peters MRN: [id number] DOB:12-01-1970, 53 y.o., female Today's Date: 06/15/2023  END OF SESSION:  PT End of Session - 06/15/23 0844     Visit Number 9    Number of Visits 13    Date for PT Re-Evaluation 07/03/23    Authorization Type [employer]    Progress Note Due on Visit 10    PT Start Time [phone]    PT Stop Time 0935    PT Time Calculation (min) 52 min    Activity Tolerance Patient tolerated treatment well    Behavior During Therapy [facility] for tasks assessed/performed              Past Medical History:  Diagnosis Date   Anxiety    Chronic left shoulder pain    Depression    History reviewed. No pertinent surgical history. Patient Active Problem List   Diagnosis Date Noted   Environmental and seasonal allergies 10/24/2015   Moderate episode of recurrent major depressive disorder (HCC) 10/24/2015    PCP: None  REFERRING PROVIDER: Shepperson, Kirstin, PA-C  REFERRING DIAG: M54.50 (ICD-10-CM) - Low back pain, unspecified M54.12 (ICD-10-CM) - Radiculopathy, cervical region  Rationale for Evaluation and Treatment: Rehabilitation  THERAPY DIAG:  Cervicalgia - Plan: PT plan of care cert/re-cert  Other low back pain - Plan: PT plan of care cert/re-cert  Other muscle spasm - Plan: PT plan of care cert/re-cert  Abnormal posture - Plan: PT plan of care cert/re-cert  ONSET DATE: 6-7 months ago  SUBJECTIVE:                                                                                                                                                                                           SUBJECTIVE STATEMENT: The traction helped but didn't last. She has made some improvements.  PERTINENT HISTORY:  Depression; anxiety; ADD  PAIN: 05/27/23 Are you having pain? Yes: NPRS scale: 5-6 in the morning but 0 once she gets moving/10 Pain location: Lt arm/shoulder Pain  description: dull & achy Aggravating factors: sitting; eaching overhead; sleeping; turning to look in blind spots. Relieving factors: nothing  PRECAUTIONS: Other: Patient has an unspecified lump on her Lt side under her arm. She is getting a diagnostic breast US  and mammogram on Friday 11/15   RED FLAGS: None   WEIGHT BEARING RESTRICTIONS: No  FALLS:  Has patient fallen in last 6 months? No  LIVING ENVIRONMENT: Lives with: lives with their son Lives in: House/apartment Stairs: Yes but she rarely goes up them   OCCUPATION: Works from home ; works  in genetics  PLOF: Independent  PATIENT GOALS: To not feel like I am falling a part  NEXT MD VISIT: PRN  OBJECTIVE:  Note: Objective measures were completed at Evaluation unless otherwise noted.  DIAGNOSTIC FINDINGS:  None  PATIENT SURVEYS:  NDI 20/50 40%  SCREENING FOR RED FLAGS: Bowel or bladder incontinence: No Spinal tumors: No Cauda equina syndrome: No Compression fracture: No Abdominal aneurysm: No  COGNITION: Overall cognitive status: Within functional limits for tasks assessed     SENSATION: Numbness and tingling ocassionaly in Lt arm  POSTURE: rounded shoulders and forward head  PALPATION: Increased muscle spasms of bilateral upper trap   Cervical ROM : AROM eval 05/27/23  Flexion 40 *pain coming up   Extension 36   Right lateral flexion 35 48  Left lateral flexion 40 45  Right rotation 60 *pain 60  Left rotation         60 *pain 65    LOWER EXTREMITY ROM:   WFL  FUNCTIONAL TESTS: NT  GAIT:  Comments: Unremarkable  TODAY'S TREATMENT:                                                                                                                              DATE:  06/15/23: Arm bike: level 2 x6 minutes (3/3)-PT present to discuss progress  Wall clock taps with blue plyo ball x 5 (down and up = 1) B Lat pull down x 20 with 40 lbs Matrix row x 2x10 with 20 lbs (25# too heavy) Lawn mower row  10# x 20 B Trigger Point Dry-Needling -  Treatment instructions: Expect mild to moderate muscle soreness. S/S of pneumothorax if dry needled over a lung field, and to seek immediate medical attention should they occur. Patient verbalized understanding of these instructions and education. Patient Consent Given: Yes Education handout provided: Previously provided Muscles treated: L UT and Infraspinatus Treatment response/outcome: Utilized skilled palpation to identify trigger points and bony landmarks.  Able to illicit twitch response and muscle elongation.  Manual soft tissue mobilization following to further promote tissue elongation. Cervical mechanical traction: 20#/5# x 10 minutes (60 seconds/10 seconds)  05/27/2023: Arm bike: level 1.5 x6 minutes (3/3)-PT present to discuss progress  Cervical A/ROM- 3 ways- measures taken after  Trigger Point Dry-Needling -  Treatment instructions: Expect mild to moderate muscle soreness. S/S of pneumothorax if dry needled over a lung field, and to seek immediate medical attention should they occur. Patient verbalized understanding of these instructions and education. Patient Consent Given: Yes Education handout provided: Previously provided Muscles treated: bil cervical multifidi, Lt rhomboids and deltoid Treatment response/outcome: Utilized skilled palpation to identify trigger points and bony landmarks.  Able to illicit twitch response and muscle elongation.  Manual soft tissue mobilization following to further promote tissue elongation. Cervical mechanical traction: 15#/5# x 10 minutes (60 seconds/10 seconds)  05/21/2023: Standing scapular sets facing wall Y position x 20 3 way scapular stabilization with yellow loop both  x 10, then with yellow x 5 Wall clock taps with blue plyo ball x 5 (down and up = 1) B Lat pull down x 20 with 40 lbs Matrix row x 20 with 25 lbs Lawn mower at [employer] 10# 2x 10 B Standing L shoulder ER x 20 Red Standing  horizontal ABD x 20 Red Supine serratus push with Red x 10, changed to unilateral 5# wt x 20 ea Thread the needle to [facility] reach (thoracic rotation) x 5 ea   05/18/2023: Lat pull down x 20 with 40 lbs Matrix row x 20 with 25 lbs Thoracic extension on foam roller horizontal and vertical, with prolonged pec stretch and ant shoulder stretch in vertical position Standing L shoulder ER x 20 Red Standing horizontal ABD x 20 Red Supine serratus push with Red x 20 Skilled palpation and monitoring of soft tissues during DN.  Trigger Point Dry-Needling -  Treatment instructions: Expect mild to moderate muscle soreness. S/S of pneumothorax if dry needled over a lung field, and to seek immediate medical attention should they occur. Patient verbalized understanding of these instructions and education. Patient Consent Given: Yes Education handout provided: Previously provided Muscles treated: L UT, pectoralis and deltoid Electrical stimulation performed: No Parameters: N/A Treatment response/outcome: Utilized skilled palpation to identify trigger points and bony landmarks.  Able to illicit twitch response and muscle elongation.  Manual soft tissue mobilization following to further promote tissue elongation.  PATIENT EDUCATION:  Education details: DN handout; POC; HEP Person educated: Patient Education method: Programmer, Multimedia, Demonstration, and Handouts Education comprehension: verbalized understanding, returned demonstration, and needs further education  HOME EXERCISE PROGRAM: Access Code: [email or web address] URL: [email or web address] Date: 05/21/2023 Prepared by: Mliss  Exercises - Seated Upper Trapezius Stretch  - 1-2 x daily - 7 x weekly - 2 sets - 20-30 hold - Seated Levator Scapulae Stretch  - 1-2 x daily - 7 x weekly - 2 sets - 20-30 hold - Seated Scapular Retraction  - 1 x daily - 7 x weekly - 2 sets - 3 hold - Prone Shoulder Extension - Single Arm  - 2 x daily - 7 x weekly - 2 sets - 10  reps - Prone Shoulder Row  - 2 x daily - 7 x weekly - 2 sets - 10 reps - Prone Single Arm Shoulder Horizontal Abduction with Scapular Retraction and Palm Down  - 2 x daily - 7 x weekly - 2 sets - 10 reps - Sidelying Shoulder External Rotation  - 2 x daily - 7 x weekly - 2 sets - 10 reps - Single Arm Serratus Punches in Supine with Dumbbell  - 2 x daily - 7 x weekly - 2 sets - 10 reps - Shoulder Flexion Wall Slide with Resistance Band  - 1 x daily - 7 x weekly - 3 sets - 10 reps - Shoulder External Rotation with Anchored Resistance  - 1 x daily - 3-4 x weekly - 1 sets - 10 reps - Standing Shoulder Horizontal Abduction with Resistance  - 1 x daily - 3-4 x weekly - 1 sets - 10 reps - Supine Serratus Punches Resistance  - 1 x daily - 3-4 x weekly - 1 sets - 10 reps - Quadruped Full Range Thoracic Rotation with Reach  - 2 x daily - 7 x weekly - 2 sets - 10 reps - Seated Thoracic Extension Arms Overhead  - 2 x daily - 7 x weekly - 2 sets - 10 reps  ASSESSMENT:  CLINICAL IMPRESSION:  Shari Peters reports about 25% improvement overall. She has no pain with ROM now in neck,but continues to wake up with stiffness. The pain she has now is more in the upper back and left shoulder area. It has gone down to her elbow intermittently but today it is just in post left shoulder. Today she demonstrated significantly less tightness and trigger points in her L neck musculature which she attributes somewhat to not being on her computer everyday for the past two weeks. We discussed using a towel roll in her pillowcase at night to see if the extra support will help as she likes the support of a pool noodle in the clinic. She felt relief with her initial trial of mechanical traction, but this did not last either. We increased the pull today and will check results next visit. It appears that posture is affecting her neck tension. She is also heavy chested which may contribute to her upper back pain as well. She has met one  LTG.  Marcedes continues to demonstrate potential for improvement and would benefit from continued skilled therapy to address impairments.  I recommend 4 more visits to assess the benefits of traction and if helpful a home unit may be indicated.   OBJECTIVE IMPAIRMENTS: decreased ROM, increased muscle spasms, postural dysfunction, and pain.   ACTIVITY LIMITATIONS: sitting, sleeping, and reach over head  PARTICIPATION LIMITATIONS: cleaning, driving, community activity, and occupation  PERSONAL FACTORS: Time since onset of injury/illness/exacerbation and 1-2 comorbidities: anxiety & depression  are also affecting patient's functional outcome.   REHAB POTENTIAL: Good  CLINICAL DECISION MAKING: Stable/uncomplicated  EVALUATION COMPLEXITY: Low   GOALS: Goals reviewed with patient? Yes  SHORT TERM GOALS: Target date: 05/18/2023  Patient will be independent with initial HEP. Baseline:  Goal status: MET  2.  Patient will report > or = to 30% improvement in symptoms since starting PT. Baseline:  Goal status: IN PROGRESS 12/9 20-25%  3.  Patient will report 30% improvement in sleep quality.  Baseline:  Goal status: IN PROGRESS 25% 12/9  LONG TERM GOALS: Target date: 06/15/2023 extended to 07/03/23  Patient will demonstrate independence in advanced HEP. Baseline:  Goal status: IN PROGRESS - met for current HEP 06/15/23  2.  Patient will report > or = to 70% improvement in symptoms since starting PT. Baseline:  Goal status: IN PROGRESS 25% improvement   3.  Patient will report > or = to 70% in sleep quality.  Baseline: pain does not limit this, sleep exacerbates pain-wake up more stiff (05/27/23) Goal status: IN PROGRESS  still wakes up with pain  4.  Patient will be able to reach over head with < or = to 2 / 10 pain. Baseline:  Goal status: MET  5.  Patient will be able to begin workout regimen  Baseline:  Goal status: IN PROGRESS - gun shy because I think I hurt myself doing  burn workout    PLAN:  PT FREQUENCY: 2x/week  PT DURATION: 8 weeks  PLANNED INTERVENTIONS: 97164- PT Re-evaluation, 97110-Therapeutic exercises, 97530- Therapeutic activity, 97112- Neuromuscular re-education, 97535- Self Care, 02859- Manual therapy, [id number]- Canalith repositioning, [id number]- Aquatic Therapy, 97014- Electrical stimulation (unattended), [id number]- Electrical stimulation (manual), [id number]- Vasopneumatic device, [id number]- Ultrasound, [id number]- Traction (mechanical), [id number]- Ionotophoresis 4mg /ml Dexamethasone, Balance training, Stair training, Taping, Dry Needling, Joint mobilization, Joint manipulation, Spinal manipulation, Spinal mobilization, Vestibular training, Cryotherapy, and Moist heat.  PLAN FOR NEXT SESSION: continue postural strengthening, left shoulder strength,  cervical mobility, see how she did  with traction  Mliss Cummins, PT  06/15/23 2:00 PM  [facility] [address] [place], [place] [address] Phone # [phone] Fax [phone]

## 2023-06-12 ENCOUNTER — Ambulatory Visit: Payer: Managed Care, Other (non HMO)

## 2023-06-15 ENCOUNTER — Encounter: Payer: Self-pay | Admitting: Physical Therapy

## 2023-06-15 ENCOUNTER — Ambulatory Visit: Payer: Managed Care, Other (non HMO) | Attending: Physician Assistant | Admitting: Physical Therapy

## 2023-06-15 DIAGNOSIS — M62838 Other muscle spasm: Secondary | ICD-10-CM | POA: Diagnosis present

## 2023-06-15 DIAGNOSIS — R293 Abnormal posture: Secondary | ICD-10-CM | POA: Insufficient documentation

## 2023-06-15 DIAGNOSIS — M542 Cervicalgia: Secondary | ICD-10-CM | POA: Diagnosis present

## 2023-06-15 DIAGNOSIS — M6281 Muscle weakness (generalized): Secondary | ICD-10-CM | POA: Insufficient documentation

## 2023-06-15 DIAGNOSIS — M5459 Other low back pain: Secondary | ICD-10-CM | POA: Insufficient documentation

## 2023-06-18 ENCOUNTER — Ambulatory Visit: Payer: Managed Care, Other (non HMO)

## 2023-06-18 DIAGNOSIS — M542 Cervicalgia: Secondary | ICD-10-CM | POA: Diagnosis not present

## 2023-06-18 DIAGNOSIS — R293 Abnormal posture: Secondary | ICD-10-CM

## 2023-06-18 DIAGNOSIS — M5459 Other low back pain: Secondary | ICD-10-CM

## 2023-06-18 DIAGNOSIS — M6281 Muscle weakness (generalized): Secondary | ICD-10-CM

## 2023-06-18 DIAGNOSIS — M62838 Other muscle spasm: Secondary | ICD-10-CM

## 2023-06-18 NOTE — Therapy (Signed)
 OUTPATIENT PHYSICAL THERAPY THORACOLUMBAR / CERVICAL TREATMENT AND RECERTIFICATION   Patient Name: Shari Peters MRN: [id number] DOB:04/04/1971, 53 y.o., female Today's Date: 06/18/2023  END OF SESSION:  PT End of Session - 06/18/23 0915     Visit Number 10    Date for PT Re-Evaluation 07/03/23    Authorization Type [employer]    PT Start Time 0847    PT Stop Time 0926    PT Time Calculation (min) 39 min    Activity Tolerance Patient tolerated treatment well    Behavior During Therapy [facility] for tasks assessed/performed               Past Medical History:  Diagnosis Date   Anxiety    Chronic left shoulder pain    Depression    History reviewed. No pertinent surgical history. Patient Active Problem List   Diagnosis Date Noted   Environmental and seasonal allergies 10/24/2015   Moderate episode of recurrent major depressive disorder (HCC) 10/24/2015    PCP: None  REFERRING PROVIDER: Shepperson, Kirstin, PA-C  REFERRING DIAG: M54.50 (ICD-10-CM) - Low back pain, unspecified M54.12 (ICD-10-CM) - Radiculopathy, cervical region  Rationale for Evaluation and Treatment: Rehabilitation  THERAPY DIAG:  Cervicalgia  Other low back pain  Other muscle spasm  Abnormal posture  Muscle weakness (generalized)  ONSET DATE: 6-7 months ago  SUBJECTIVE:                                                                                                                                                                                           SUBJECTIVE STATEMENT: I didn't sleep well last night.  Traction helps me.    PERTINENT HISTORY:  Depression; anxiety; ADD  PAIN: 06/18/23 Are you having pain? Yes: NPRS scale: 0-1/10 Pain location: Lt arm/shoulder Pain description: dull & achy Aggravating factors: sitting; eaching overhead; sleeping; turning to look in blind spots. Relieving factors: nothing  PRECAUTIONS: Other: Patient has an unspecified lump on her Lt side under her arm.  She is getting a diagnostic breast US  and mammogram on Friday 11/15   RED FLAGS: None   WEIGHT BEARING RESTRICTIONS: No  FALLS:  Has patient fallen in last 6 months? No  LIVING ENVIRONMENT: Lives with: lives with their son Lives in: House/apartment Stairs: Yes but she rarely goes up them   OCCUPATION: Works from home ; works in oncologist  PLOF: Independent  PATIENT GOALS: To not feel like I am falling a part  NEXT MD VISIT: PRN  OBJECTIVE:  Note: Objective measures were completed at Evaluation unless otherwise noted.  DIAGNOSTIC FINDINGS:  None  PATIENT SURVEYS:  NDI 20/50 40%  SCREENING FOR RED FLAGS: Bowel or bladder incontinence: No Spinal tumors: No Cauda equina syndrome: No Compression fracture: No Abdominal aneurysm: No  COGNITION: Overall cognitive status: Within functional limits for tasks assessed     SENSATION: Numbness and tingling ocassionaly in Lt arm  POSTURE: rounded shoulders and forward head  PALPATION: Increased muscle spasms of bilateral upper trap   Cervical ROM : AROM eval 05/27/23  Flexion 40 *pain coming up   Extension 36   Right lateral flexion 35 48  Left lateral flexion 40 45  Right rotation 60 *pain 60  Left rotation         60 *pain 65    LOWER EXTREMITY ROM:   WFL  FUNCTIONAL TESTS: NT  GAIT:  Comments: Unremarkable  TODAY'S TREATMENT:                                                                                                                              DATE:  06/15/23: Arm bike: level 2 x6 minutes (3/3)-PT present to discuss progress  Wall clock taps with blue plyo loop x 5 (down and up = 1) B Lat pull down x 20 with 40 lbs Matrix row x 2x10 with 20 lbs (25# too heavy) Lawn mower row 10# x 20 B- free weights  Cervical mechanical traction: 20#/5# x 15 minutes (60 seconds/10 seconds)  05/27/2023: Arm bike: level 1.5 x6 minutes (3/3)-PT present to discuss progress  Cervical A/ROM- 3 ways- measures taken  after  Trigger Point Dry-Needling -  Treatment instructions: Expect mild to moderate muscle soreness. S/S of pneumothorax if dry needled over a lung field, and to seek immediate medical attention should they occur. Patient verbalized understanding of these instructions and education. Patient Consent Given: Yes Education handout provided: Previously provided Muscles treated: bil cervical multifidi, Lt rhomboids and deltoid Treatment response/outcome: Utilized skilled palpation to identify trigger points and bony landmarks.  Able to illicit twitch response and muscle elongation.  Manual soft tissue mobilization following to further promote tissue elongation. Cervical mechanical traction: 15#/5# x 10 minutes (60 seconds/10 seconds)  05/21/2023: Standing scapular sets facing wall Y position x 20 3 way scapular stabilization with yellow loop both x 10, then with yellow x 5 Wall clock taps with blue plyo ball x 5 (down and up = 1) B Lat pull down x 20 with 40 lbs Matrix row x 20 with 25 lbs Lawn mower at [employer] 10# 2x 10 B Standing L shoulder ER x 20 Red Standing horizontal ABD x 20 Red Supine serratus push with Red x 10, changed to unilateral 5# wt x 20 ea Thread the needle to [facility] reach (thoracic rotation) x 5 ea   PATIENT EDUCATION:  Education details: DN handout; POC; HEP Person educated: Patient Education method: Programmer, Multimedia, Demonstration, and Handouts Education comprehension: verbalized understanding, returned demonstration, and needs further education  HOME EXERCISE PROGRAM: Access Code: [email or web address] URL: [email or web address] Date: 05/21/2023 Prepared by: Mliss  Exercises - Seated  Upper Trapezius Stretch  - 1-2 x daily - 7 x weekly - 2 sets - 20-30 hold - Seated Levator Scapulae Stretch  - 1-2 x daily - 7 x weekly - 2 sets - 20-30 hold - Seated Scapular Retraction  - 1 x daily - 7 x weekly - 2 sets - 3 hold - Prone Shoulder Extension - Single Arm  - 2 x daily - 7 x  weekly - 2 sets - 10 reps - Prone Shoulder Row  - 2 x daily - 7 x weekly - 2 sets - 10 reps - Prone Single Arm Shoulder Horizontal Abduction with Scapular Retraction and Palm Down  - 2 x daily - 7 x weekly - 2 sets - 10 reps - Sidelying Shoulder External Rotation  - 2 x daily - 7 x weekly - 2 sets - 10 reps - Single Arm Serratus Punches in Supine with Dumbbell  - 2 x daily - 7 x weekly - 2 sets - 10 reps - Shoulder Flexion Wall Slide with Resistance Band  - 1 x daily - 7 x weekly - 3 sets - 10 reps - Shoulder External Rotation with Anchored Resistance  - 1 x daily - 3-4 x weekly - 1 sets - 10 reps - Standing Shoulder Horizontal Abduction with Resistance  - 1 x daily - 3-4 x weekly - 1 sets - 10 reps - Supine Serratus Punches Resistance  - 1 x daily - 3-4 x weekly - 1 sets - 10 reps - Quadruped Full Range Thoracic Rotation with Reach  - 2 x daily - 7 x weekly - 2 sets - 10 reps - Seated Thoracic Extension Arms Overhead  - 2 x daily - 7 x weekly - 2 sets - 10 reps  ASSESSMENT:  CLINICAL IMPRESSION: Pt continues to reports about 25% improvement overall. She has no pain with ROM now in neck,but continues to wake up with stiffness. The pain she has now is more in the upper back and left shoulder area due to not sleeping well last night. Pt responded well to increased pull on traction and this was repeated today. . It appears that posture is affecting her neck tension.  Taiz continues to demonstrate potential for improvement and would benefit from continued skilled therapy to address impairments.  3 more visits planned.  Patient will benefit from skilled PT to address the below impairments and improve overall function.    OBJECTIVE IMPAIRMENTS: decreased ROM, increased muscle spasms, postural dysfunction, and pain.   ACTIVITY LIMITATIONS: sitting, sleeping, and reach over head  PARTICIPATION LIMITATIONS: cleaning, driving, community activity, and occupation  PERSONAL FACTORS: Time since onset of  injury/illness/exacerbation and 1-2 comorbidities: anxiety & depression  are also affecting patient's functional outcome.   REHAB POTENTIAL: Good  CLINICAL DECISION MAKING: Stable/uncomplicated  EVALUATION COMPLEXITY: Low   GOALS: Goals reviewed with patient? Yes  SHORT TERM GOALS: Target date: 05/18/2023  Patient will be independent with initial HEP. Baseline:  Goal status: MET  2.  Patient will report > or = to 30% improvement in symptoms since starting PT. Baseline:  Goal status: IN PROGRESS 12/9 20-25%  3.  Patient will report 30% improvement in sleep quality.  Baseline:  Goal status: IN PROGRESS 25% 12/9  LONG TERM GOALS: Target date: 06/15/2023 extended to 07/03/23  Patient will demonstrate independence in advanced HEP. Baseline:  Goal status: IN PROGRESS - met for current HEP 06/15/23  2.  Patient will report > or = to 70% improvement in symptoms  since starting PT. Baseline:  Goal status: IN PROGRESS 25% improvement   3.  Patient will report > or = to 70% in sleep quality.  Baseline: pain does not limit this, sleep exacerbates pain-wake up more stiff (05/27/23) Goal status: IN PROGRESS  still wakes up with pain  4.  Patient will be able to reach over head with < or = to 2 / 10 pain. Baseline:  Goal status: MET  5.  Patient will be able to begin workout regimen  Baseline:  Goal status: IN PROGRESS - gun shy because I think I hurt myself doing burn workout    PLAN:  PT FREQUENCY: 2x/week  PT DURATION: 8 weeks  PLANNED INTERVENTIONS: 97164- PT Re-evaluation, 97110-Therapeutic exercises, 97530- Therapeutic activity, 97112- Neuromuscular re-education, 97535- Self Care, 02859- Manual therapy, [id number]- Canalith repositioning, [id number]- Aquatic Therapy, 97014- Electrical stimulation (unattended), [id number]- Electrical stimulation (manual), [id number]- Vasopneumatic device, [id number]- Ultrasound, [id number]- Traction (mechanical), [id number]- Ionotophoresis 4mg /ml Dexamethasone, Balance  training, Stair training, Taping, Dry Needling, Joint mobilization, Joint manipulation, Spinal manipulation, Spinal mobilization, Vestibular training, Cryotherapy, and Moist heat.  PLAN FOR NEXT SESSION: continue postural strengthening, left shoulder strength,  cervical mobility, see how she did with traction, DN as needed  Burnard Joy, PT 06/18/23 9:29 AM  [facility] [address] [place], [place] [address] Phone # [phone] Fax [phone]

## 2023-06-21 ENCOUNTER — Encounter: Payer: Self-pay | Admitting: Physical Therapy

## 2023-06-21 NOTE — Therapy (Signed)
 OUTPATIENT PHYSICAL THERAPY THORACOLUMBAR / CERVICAL TREATMENT    Patient Name: Shari Peters MRN: [id number] DOB:1970/10/30, 53 y.o., female Today's Date: 06/22/2023  END OF SESSION:  PT End of Session - 06/22/23 0848     Visit Number 11    Number of Visits 13    Date for PT Re-Evaluation 07/03/23    Authorization Type [employer]    PT Start Time 0845    PT Stop Time 0934    PT Time Calculation (min) 49 min    Activity Tolerance Patient tolerated treatment well    Behavior During Therapy WFL for tasks assessed/performed                Past Medical History:  Diagnosis Date   Anxiety    Chronic left shoulder pain    Depression    History reviewed. No pertinent surgical history. Patient Active Problem List   Diagnosis Date Noted   Environmental and seasonal allergies 10/24/2015   Moderate episode of recurrent major depressive disorder (HCC) 10/24/2015    PCP: None  REFERRING PROVIDER: Shepperson, Kirstin, PA-C  REFERRING DIAG: M54.50 (ICD-10-CM) - Low back pain, unspecified M54.12 (ICD-10-CM) - Radiculopathy, cervical region  Rationale for Evaluation and Treatment: Rehabilitation  THERAPY DIAG:  Cervicalgia  Other low back pain  Other muscle spasm  Abnormal posture  Muscle weakness (generalized)  ONSET DATE: 6-7 months ago  SUBJECTIVE:                                                                                                                                                                                           SUBJECTIVE STATEMENT: Pain is specifically in one spot by my shoulder blade    PERTINENT HISTORY:  Depression; anxiety; ADD  PAIN: 06/18/23 Are you having pain? Yes: NPRS scale: 0- 4 when moving arm/10 Pain location: Lt arm/shoulder Pain description: dull & achy Aggravating factors: sitting; eaching overhead; sleeping; turning to look in blind spots. Relieving factors: nothing  PRECAUTIONS: Other: Patient has an unspecified lump on  her Lt side under her arm. She is getting a diagnostic breast US  and mammogram on Friday 11/15   RED FLAGS: None   WEIGHT BEARING RESTRICTIONS: No  FALLS:  Has patient fallen in last 6 months? No  LIVING ENVIRONMENT: Lives with: lives with their son Lives in: House/apartment Stairs: Yes but she rarely goes up them   OCCUPATION: Works from home ; works in oncologist  PLOF: Independent  PATIENT GOALS: To not feel like I am falling a part  NEXT MD VISIT: PRN  OBJECTIVE:  Note: Objective measures were completed at Evaluation unless otherwise noted.  DIAGNOSTIC FINDINGS:  None  PATIENT SURVEYS:  NDI 20/50 40%  SCREENING FOR RED FLAGS: Bowel or bladder incontinence: No Spinal tumors: No Cauda equina syndrome: No Compression fracture: No Abdominal aneurysm: No  COGNITION: Overall cognitive status: Within functional limits for tasks assessed     SENSATION: Numbness and tingling ocassionaly in Lt arm  POSTURE: rounded shoulders and forward head  PALPATION: Increased muscle spasms of bilateral upper trap   Cervical ROM : AROM eval 05/27/23  Flexion 40 *pain coming up   Extension 36   Right lateral flexion 35 48  Left lateral flexion 40 45  Right rotation 60 *pain 60  Left rotation         60 *pain 65    LOWER EXTREMITY ROM:   WFL  FUNCTIONAL TESTS: NT  GAIT:  Comments: Unremarkable  TODAY'S TREATMENT:                                                                                                                              DATE:  06/22/23: Arm bike: level 2 x6 minutes (3/3)-PT present to discuss progress  Low trap lift offs facing wall x 20 Wall clock taps with blue plyo l ball  x 10 (down and up = 1) B Standing green band diagonals x 10 B Trigger Point Dry Needling  Subsequent Treatment: Instructions provided previously at initial dry needling treatment.  Instructions reviewed, if requested by the patient, prior to subsequent dry needling  treatment.   Patient Verbal Consent Given: Yes Education Handout Provided: Previously Provided Muscles Treated: L UT, levator and rhomboids Electrical Stimulation Performed: No Treatment Response/Outcome: Utilized skilled palpation to identify bony landmarks and trigger points.  Able to illicit twitch response and muscle elongation.  Soft tissue mobilization to L UT, levator and rhomboids following to further promote tissue elongation and decreased pain.     Cervical mechanical traction: 20#/5# x 15 minutes (60 seconds/10 seconds)  06/15/23: Arm bike: level 2 x6 minutes (3/3)-PT present to discuss progress  Wall clock taps with blue plyo loop x 5 (down and up = 1) B Lat pull down x 20 with 40 lbs Matrix row x 2x10 with 20 lbs (25# too heavy) Lawn mower row 10# x 20 B- free weights  Cervical mechanical traction: 20#/5# x 15 minutes (60 seconds/10 seconds)  05/27/2023: Arm bike: level 1.5 x6 minutes (3/3)-PT present to discuss progress  Cervical A/ROM- 3 ways- measures taken after  Trigger Point Dry-Needling -  Treatment instructions: Expect mild to moderate muscle soreness. S/S of pneumothorax if dry needled over a lung field, and to seek immediate medical attention should they occur. Patient verbalized understanding of these instructions and education. Patient Consent Given: Yes Education handout provided: Previously provided Muscles treated: bil cervical multifidi, Lt rhomboids and deltoid Treatment response/outcome: Utilized skilled palpation to identify trigger points and bony landmarks.  Able to illicit twitch response and muscle elongation.  Manual soft tissue mobilization following to further  promote tissue elongation. Cervical mechanical traction: 15#/5# x 10 minutes (60 seconds/10 seconds)  05/21/2023: Standing scapular sets facing wall Y position x 20 3 way scapular stabilization with yellow loop both x 10, then with yellow x 5 Wall clock taps with blue plyo ball x 5 (down and  up = 1) B Lat pull down x 20 with 40 lbs Matrix row x 20 with 25 lbs Lawn mower at [employer] 10# 2x 10 B Standing L shoulder ER x 20 Red Standing horizontal ABD x 20 Red Supine serratus push with Red x 10, changed to unilateral 5# wt x 20 ea Thread the needle to [facility] reach (thoracic rotation) x 5 ea   PATIENT EDUCATION:  Education details: DN handout; POC; HEP Person educated: Patient Education method: Programmer, Multimedia, Demonstration, and Handouts Education comprehension: verbalized understanding, returned demonstration, and needs further education  HOME EXERCISE PROGRAM: Access Code: [email or web address] URL: [email or web address] Date: 05/21/2023 Prepared by: Mliss  Exercises - Seated Upper Trapezius Stretch  - 1-2 x daily - 7 x weekly - 2 sets - 20-30 hold - Seated Levator Scapulae Stretch  - 1-2 x daily - 7 x weekly - 2 sets - 20-30 hold - Seated Scapular Retraction  - 1 x daily - 7 x weekly - 2 sets - 3 hold - Prone Shoulder Extension - Single Arm  - 2 x daily - 7 x weekly - 2 sets - 10 reps - Prone Shoulder Row  - 2 x daily - 7 x weekly - 2 sets - 10 reps - Prone Single Arm Shoulder Horizontal Abduction with Scapular Retraction and Palm Down  - 2 x daily - 7 x weekly - 2 sets - 10 reps - Sidelying Shoulder External Rotation  - 2 x daily - 7 x weekly - 2 sets - 10 reps - Single Arm Serratus Punches in Supine with Dumbbell  - 2 x daily - 7 x weekly - 2 sets - 10 reps - Shoulder Flexion Wall Slide with Resistance Band  - 1 x daily - 7 x weekly - 3 sets - 10 reps - Shoulder External Rotation with Anchored Resistance  - 1 x daily - 3-4 x weekly - 1 sets - 10 reps - Standing Shoulder Horizontal Abduction with Resistance  - 1 x daily - 3-4 x weekly - 1 sets - 10 reps - Supine Serratus Punches Resistance  - 1 x daily - 3-4 x weekly - 1 sets - 10 reps - Quadruped Full Range Thoracic Rotation with Reach  - 2 x daily - 7 x weekly - 2 sets - 10 reps - Seated Thoracic Extension Arms Overhead  -  2 x daily - 7 x weekly - 2 sets - 10 reps  ASSESSMENT:  CLINICAL IMPRESSION: Patient continues to get relief from traction. She still has trigger points in the L UT, levator and rhomboids and this is where she is feeling her pain. Excellent twitch response in rhomboids today. She plans to see if insurance will cover a home traction unit.   2 more visits planned before discharge.   OBJECTIVE IMPAIRMENTS: decreased ROM, increased muscle spasms, postural dysfunction, and pain.   ACTIVITY LIMITATIONS: sitting, sleeping, and reach over head  PARTICIPATION LIMITATIONS: cleaning, driving, community activity, and occupation  PERSONAL FACTORS: Time since onset of injury/illness/exacerbation and 1-2 comorbidities: anxiety & depression  are also affecting patient's functional outcome.   REHAB POTENTIAL: Good  CLINICAL DECISION MAKING: Stable/uncomplicated  EVALUATION COMPLEXITY: Low   GOALS: Goals reviewed with patient?  Yes  SHORT TERM GOALS: Target date: 05/18/2023  Patient will be independent with initial HEP. Baseline:  Goal status: MET  2.  Patient will report > or = to 30% improvement in symptoms since starting PT. Baseline:  Goal status: IN PROGRESS 12/9 20-25%  3.  Patient will report 30% improvement in sleep quality.  Baseline:  Goal status: IN PROGRESS 25% 12/9  LONG TERM GOALS: Target date: 06/15/2023 extended to 07/03/23  Patient will demonstrate independence in advanced HEP. Baseline:  Goal status: IN PROGRESS - met for current HEP 06/15/23  2.  Patient will report > or = to 70% improvement in symptoms since starting PT. Baseline:  Goal status: IN PROGRESS 25% improvement   3.  Patient will report > or = to 70% in sleep quality.  Baseline: pain does not limit this, sleep exacerbates pain-wake up more stiff (05/27/23) Goal status: IN PROGRESS  still wakes up with pain  4.  Patient will be able to reach over head with < or = to 2 / 10 pain. Baseline:  Goal status:  MET  5.  Patient will be able to begin workout regimen  Baseline:  Goal status: IN PROGRESS - gun shy because I think I hurt myself doing burn workout    PLAN:  PT FREQUENCY: 2x/week  PT DURATION: 8 weeks  PLANNED INTERVENTIONS: 97164- PT Re-evaluation, 97110-Therapeutic exercises, 97530- Therapeutic activity, 97112- Neuromuscular re-education, 97535- Self Care, 02859- Manual therapy, [id number]- Canalith repositioning, [id number]- Aquatic Therapy, 97014- Electrical stimulation (unattended), [id number]- Electrical stimulation (manual), [id number]- Vasopneumatic device, [id number]- Ultrasound, [id number]- Traction (mechanical), [id number]- Ionotophoresis 4mg /ml Dexamethasone, Balance training, Stair training, Taping, Dry Needling, Joint mobilization, Joint manipulation, Spinal manipulation, Spinal mobilization, Vestibular training, Cryotherapy, and Moist heat.  PLAN FOR NEXT SESSION: continue postural strengthening, left shoulder strength,  cervical mobility, see how she did with traction, DN as needed  Mliss Cummins, PT  06/22/23 9:27 AM  [facility] [address] [place], [place] [address] Phone # [phone] Fax [phone]

## 2023-06-22 ENCOUNTER — Ambulatory Visit: Payer: Managed Care, Other (non HMO) | Admitting: Physical Therapy

## 2023-06-22 ENCOUNTER — Encounter: Payer: Self-pay | Admitting: Physical Therapy

## 2023-06-22 DIAGNOSIS — M5459 Other low back pain: Secondary | ICD-10-CM

## 2023-06-22 DIAGNOSIS — M542 Cervicalgia: Secondary | ICD-10-CM

## 2023-06-22 DIAGNOSIS — M62838 Other muscle spasm: Secondary | ICD-10-CM

## 2023-06-22 DIAGNOSIS — R293 Abnormal posture: Secondary | ICD-10-CM

## 2023-06-22 DIAGNOSIS — M6281 Muscle weakness (generalized): Secondary | ICD-10-CM

## 2023-06-25 ENCOUNTER — Ambulatory Visit: Payer: Managed Care, Other (non HMO)

## 2023-06-25 DIAGNOSIS — M5459 Other low back pain: Secondary | ICD-10-CM

## 2023-06-25 DIAGNOSIS — M542 Cervicalgia: Secondary | ICD-10-CM

## 2023-06-25 DIAGNOSIS — M62838 Other muscle spasm: Secondary | ICD-10-CM

## 2023-06-25 DIAGNOSIS — R293 Abnormal posture: Secondary | ICD-10-CM

## 2023-06-25 NOTE — Therapy (Signed)
OUTPATIENT PHYSICAL THERAPY THORACOLUMBAR / CERVICAL TREATMENT    Patient Name: Shari Peters MRN: [id number] DOB:1970-11-18, 53 y.o., female Today's Date: 06/25/2023  END OF SESSION:  PT End of Session - 06/25/23 0922     Visit Number 12    Date for PT Re-Evaluation 07/03/23    Authorization Type [employer]    PT Start Time 0848    PT Stop Time 0930    PT Time Calculation (min) 42 min    Activity Tolerance Patient tolerated treatment well    Behavior During Therapy WFL for tasks assessed/performed                 Past Medical History:  Diagnosis Date   Anxiety    Chronic left shoulder pain    Depression    History reviewed. No pertinent surgical history. Patient Active Problem List   Diagnosis Date Noted   Environmental and seasonal allergies 10/24/2015   Moderate episode of recurrent major depressive disorder (HCC) 10/24/2015    PCP: None  REFERRING PROVIDER: Julien Girt, PA-C  REFERRING DIAG: M54.50 (ICD-10-CM) - Low back pain, unspecified M54.12 (ICD-10-CM) - Radiculopathy, cervical region  Rationale for Evaluation and Treatment: Rehabilitation  THERAPY DIAG:  Cervicalgia  Other low back pain  Other muscle spasm  Abnormal posture  ONSET DATE: 6-7 months ago  SUBJECTIVE:                                                                                                                                                                                           SUBJECTIVE STATEMENT: Pain is specifically in one spot by my shoulder blade    PERTINENT HISTORY:  Depression; anxiety; ADD  PAIN: 06/18/23 Are you having pain? Yes: NPRS scale: 0- 4 when moving arm/10 Pain location: Lt arm/shoulder Pain description: dull & achy Aggravating factors: sitting; eaching overhead; sleeping; turning to look in blind spots. Relieving factors: nothing  PRECAUTIONS: Other: Patient has an unspecified lump on her Lt side under her arm. She is getting a diagnostic  breast US and mammogram on Friday 11/15   RED FLAGS: None   WEIGHT BEARING RESTRICTIONS: No  FALLS:  Has patient fallen in last 6 months? No  LIVING ENVIRONMENT: Lives with: lives with their son Lives in: House/apartment Stairs: Yes but she rarely goes up them   OCCUPATION: Works from home ; works in Oncologist  PLOF: Independent  PATIENT GOALS: To not feel like I am falling a part  NEXT MD VISIT: PRN  OBJECTIVE:  Note: Objective measures were completed at Evaluation unless otherwise noted.  DIAGNOSTIC FINDINGS:  None  PATIENT SURVEYS:  NDI 20/50  40%  SCREENING FOR RED FLAGS: Bowel or bladder incontinence: No Spinal tumors: No Cauda equina syndrome: No Compression fracture: No Abdominal aneurysm: No  COGNITION: Overall cognitive status: Within functional limits for tasks assessed     SENSATION: Numbness and tingling ocassionaly in Lt arm  POSTURE: rounded shoulders and forward head  PALPATION: Increased muscle spasms of bilateral upper trap   Cervical ROM : AROM eval 05/27/23  Flexion 40 *pain coming up   Extension 36   Right lateral flexion 35 48  Left lateral flexion 40 45  Right rotation 60 *pain 60  Left rotation         60 *pain 65    LOWER EXTREMITY ROM:   WFL  FUNCTIONAL TESTS: NT  GAIT:  Comments: Unremarkable  TODAY'S TREATMENT:                                                                                                                              DATE:  06/24/23: Arm bike: level 2 x6 minutes (3/3)-PT present to discuss progress  Seated on green ball: 2# weights 2x10 bil each Seated on ball: green ball horizontal abduction 2x10 Low trap lift offs facing wall x 20 Wall clocks yellow band x5 each  Cervical mechanical traction: 20#/5# x 15 minutes (60 seconds/10 seconds)   06/22/23: Arm bike: level 2 x6 minutes (3/3)-PT present to discuss progress  Low trap lift offs facing wall x 20 Wall clock taps with blue plyo l ball  x 10  (down and up = 1) B Standing green band diagonals x 10 B Trigger Point Dry Needling  Subsequent Treatment: Instructions provided previously at initial dry needling treatment.  Instructions reviewed, if requested by the patient, prior to subsequent dry needling treatment.   Patient Verbal Consent Given: Yes Education Handout Provided: Previously Provided Muscles Treated: L UT, levator and rhomboids Electrical Stimulation Performed: No Treatment Response/Outcome: Utilized skilled palpation to identify bony landmarks and trigger points.  Able to illicit twitch response and muscle elongation.  Soft tissue mobilization to L UT, levator and rhomboids following to further promote tissue elongation and decreased pain.     Cervical mechanical traction: 20#/5# x 15 minutes (60 seconds/10 seconds)  06/15/23: Arm bike: level 2 x6 minutes (3/3)-PT present to discuss progress  Wall clock taps with blue plyo loop x 5 (down and up = 1) B Lat pull down x 20 with 40 lbs Matrix row x 2x10 with 20 lbs (25# too heavy) Lawn mower row 10# x 20 B- free weights  Cervical mechanical traction: 20#/5# x 15 minutes (60 seconds/10 seconds)  05/27/2023: Arm bike: level 1.5 x6 minutes (3/3)-PT present to discuss progress  Cervical A/ROM- 3 ways- measures taken after  Trigger Point Dry-Needling -  Treatment instructions: Expect mild to moderate muscle soreness. S/S of pneumothorax if dry needled over a lung field, and to seek immediate medical attention should they occur. Patient verbalized understanding of these instructions and education.  Patient Consent Given: Yes Education handout provided: Previously provided Muscles treated: bil cervical multifidi, Lt rhomboids and deltoid Treatment response/outcome: Utilized skilled palpation to identify trigger points and bony landmarks.  Able to illicit twitch response and muscle elongation.  Manual soft tissue mobilization following to further promote tissue  elongation. Cervical mechanical traction: 15#/5# x 10 minutes (60 seconds/10 seconds)  05/21/2023: Standing scapular sets facing wall Y position x 20 3 way scapular stabilization with yellow loop both x 10, then with yellow x 5 Wall clock taps with blue plyo ball x 5 (down and up = 1) B Lat pull down x 20 with 40 lbs Matrix row x 20 with 25 lbs Lawn mower at [employer] 10# 2x 10 B Standing L shoulder ER x 20 Red Standing horizontal ABD x 20 Red Supine serratus push with Red x 10, changed to unilateral 5# wt x 20 ea Thread the needle to [facility] reach (thoracic rotation) x 5 ea   PATIENT EDUCATION:  Education details: DN handout; POC; HEP Person educated: Patient Education method: Programmer, multimedia, Demonstration, and Handouts Education comprehension: verbalized understanding, returned demonstration, and needs further education  HOME EXERCISE PROGRAM: Access Code: [email or web address] URL: [email or web address] Date: 05/21/2023 Prepared by: Raynelle Fanning  Exercises - Seated Upper Trapezius Stretch  - 1-2 x daily - 7 x weekly - 2 sets - 20-30 hold - Seated Levator Scapulae Stretch  - 1-2 x daily - 7 x weekly - 2 sets - 20-30 hold - Seated Scapular Retraction  - 1 x daily - 7 x weekly - 2 sets - 3 hold - Prone Shoulder Extension - Single Arm  - 2 x daily - 7 x weekly - 2 sets - 10 reps - Prone Shoulder Row  - 2 x daily - 7 x weekly - 2 sets - 10 reps - Prone Single Arm Shoulder Horizontal Abduction with Scapular Retraction and Palm Down  - 2 x daily - 7 x weekly - 2 sets - 10 reps - Sidelying Shoulder External Rotation  - 2 x daily - 7 x weekly - 2 sets - 10 reps - Single Arm Serratus Punches in Supine with Dumbbell  - 2 x daily - 7 x weekly - 2 sets - 10 reps - Shoulder Flexion Wall Slide with Resistance Band  - 1 x daily - 7 x weekly - 3 sets - 10 reps - Shoulder External Rotation with Anchored Resistance  - 1 x daily - 3-4 x weekly - 1 sets - 10 reps - Standing Shoulder Horizontal Abduction with  Resistance  - 1 x daily - 3-4 x weekly - 1 sets - 10 reps - Supine Serratus Punches Resistance  - 1 x daily - 3-4 x weekly - 1 sets - 10 reps - Quadruped Full Range Thoracic Rotation with Reach  - 2 x daily - 7 x weekly - 2 sets - 10 reps - Seated Thoracic Extension Arms Overhead  - 2 x daily - 7 x weekly - 2 sets - 10 reps  ASSESSMENT:  CLINICAL IMPRESSION: Patient continues to get relief from traction. Pt did well with advancement of exercise in the clinic today.  PT monitored throughout session for technique and pain. She plans to see if insurance will cover a home traction unit.   1 more visit planned.  Patient will benefit from skilled PT to address the below impairments and improve overall function.    OBJECTIVE IMPAIRMENTS: decreased ROM, increased muscle spasms, postural dysfunction, and pain.   ACTIVITY LIMITATIONS: sitting, sleeping,  and reach over head  PARTICIPATION LIMITATIONS: cleaning, driving, community activity, and occupation  PERSONAL FACTORS: Time since onset of injury/illness/exacerbation and 1-2 comorbidities: anxiety & depression  are also affecting patient's functional outcome.   REHAB POTENTIAL: Good  CLINICAL DECISION MAKING: Stable/uncomplicated  EVALUATION COMPLEXITY: Low   GOALS: Goals reviewed with patient? Yes  SHORT TERM GOALS: Target date: 05/18/2023  Patient will be independent with initial HEP. Baseline:  Goal status: MET  2.  Patient will report > or = to 30% improvement in symptoms since starting PT. Baseline:  Goal status: IN PROGRESS 12/9 20-25%  3.  Patient will report 30% improvement in sleep quality.  Baseline:  Goal status: IN PROGRESS 25% 12/9  LONG TERM GOALS: Target date: 06/15/2023 extended to 07/03/23  Patient will demonstrate independence in advanced HEP. Baseline:  Goal status: IN PROGRESS - met for current HEP 06/15/23  2.  Patient will report > or = to 70% improvement in symptoms since starting PT. Baseline:  Goal  status: IN PROGRESS 25% improvement   3.  Patient will report > or = to 70% in sleep quality.  Baseline: pain does not limit this, sleep exacerbates pain-wake up more stiff (05/27/23) Goal status: IN PROGRESS  still wakes up with pain  4.  Patient will be able to reach over head with < or = to 2 / 10 pain. Baseline:  Goal status: MET  5.  Patient will be able to begin workout regimen  Baseline:  Goal status: IN PROGRESS - "gun shy because I think I hurt myself doing burn workout"    PLAN:  PT FREQUENCY: 2x/week  PT DURATION: 8 weeks  PLANNED INTERVENTIONS: 97164- PT Re-evaluation, 97110-Therapeutic exercises, 97530- Therapeutic activity, 97112- Neuromuscular re-education, 97535- Self Care, 02725- Manual therapy, [id number]- Canalith repositioning, [id number]- Aquatic Therapy, 97014- Electrical stimulation (unattended), [id number]- Electrical stimulation (manual), [id number]- Vasopneumatic device, [id number]- Ultrasound, [id number]- Traction (mechanical), [id number]- Ionotophoresis 4mg /ml Dexamethasone, Balance training, Stair training, Taping, Dry Needling, Joint mobilization, Joint manipulation, Spinal manipulation, Spinal mobilization, Vestibular training, Cryotherapy, and Moist heat.  PLAN FOR NEXT SESSION: 1 more session.  Finalize HEP  Lorrene Reid, PT 06/25/23 9:30 AM  [facility] [address], [address] [place], [place] [address] Phone # [phone] Fax [phone]

## 2023-07-01 ENCOUNTER — Ambulatory Visit: Payer: Managed Care, Other (non HMO)

## 2023-07-01 DIAGNOSIS — R293 Abnormal posture: Secondary | ICD-10-CM

## 2023-07-01 DIAGNOSIS — M62838 Other muscle spasm: Secondary | ICD-10-CM

## 2023-07-01 DIAGNOSIS — M542 Cervicalgia: Secondary | ICD-10-CM | POA: Diagnosis not present

## 2023-07-01 DIAGNOSIS — M6281 Muscle weakness (generalized): Secondary | ICD-10-CM

## 2023-07-01 NOTE — Therapy (Signed)
OUTPATIENT PHYSICAL THERAPY THORACOLUMBAR / CERVICAL TREATMENT    Patient Name: Shari Peters MRN: [id number] DOB:11-22-1970, 53 y.o., female Today's Date: 07/01/2023  END OF SESSION:  PT End of Session - 07/01/23 1626     Visit Number 13    Authorization Type [employer]    PT Start Time 1616    PT Stop Time 1700    PT Time Calculation (min) 44 min    Activity Tolerance Patient tolerated treatment well    Behavior During Therapy WFL for tasks assessed/performed                  Past Medical History:  Diagnosis Date   Anxiety    Chronic left shoulder pain    Depression    No past surgical history on file. Patient Active Problem List   Diagnosis Date Noted   Environmental and seasonal allergies 10/24/2015   Moderate episode of recurrent major depressive disorder (HCC) 10/24/2015    PCP: None  REFERRING PROVIDER: Julien Girt, PA-C  REFERRING DIAG: M54.50 (ICD-10-CM) - Low back pain, unspecified M54.12 (ICD-10-CM) - Radiculopathy, cervical region  Rationale for Evaluation and Treatment: Rehabilitation  THERAPY DIAG:  Cervicalgia  Other muscle spasm  Abnormal posture  Muscle weakness (generalized)  ONSET DATE: 6-7 months ago  SUBJECTIVE:                                                                                                                                                                                           SUBJECTIVE STATEMENT: I am still tight and having pain.  I have been driving a lot.  25% improvement.  PERTINENT HISTORY:  Depression; anxiety; ADD  PAIN: 07/01/23 Are you having pain? Yes: NPRS scale: 0-6/10 Pain location: Lt arm/shoulder Pain description: dull & achy Aggravating factors: sitting; eaching overhead; sleeping; turning to look in blind spots. Relieving factors: nothing  PRECAUTIONS: Other: Patient has an unspecified lump on her Lt side under her arm. She is getting a diagnostic breast US and mammogram on Friday  11/15   RED FLAGS: None   WEIGHT BEARING RESTRICTIONS: No  FALLS:  Has patient fallen in last 6 months? No  LIVING ENVIRONMENT: Lives with: lives with their son Lives in: House/apartment Stairs: Yes but she rarely goes up them   OCCUPATION: Works from home ; works in [employer]  PLOF: Independent  PATIENT GOALS: To not feel like I am falling a part  NEXT MD VISIT: PRN  OBJECTIVE:  Note: Objective measures were completed at Evaluation unless otherwise noted.  DIAGNOSTIC FINDINGS:  None  PATIENT SURVEYS:  NDI 20/50 40% 07/01/23: 12/50 24%   SCREENING  FOR RED FLAGS: Bowel or bladder incontinence: No Spinal tumors: No Cauda equina syndrome: No Compression fracture: No Abdominal aneurysm: No  COGNITION: Overall cognitive status: Within functional limits for tasks assessed     SENSATION: Numbness and tingling ocassionaly in Lt arm  POSTURE: rounded shoulders and forward head  PALPATION: Increased muscle spasms of bilateral upper trap   Cervical ROM : AROM eval 05/27/23  Flexion 40 *pain coming up   Extension 36   Right lateral flexion 35 48  Left lateral flexion 40 45  Right rotation 60 *pain 60  Left rotation         60 *pain 65    LOWER EXTREMITY ROM:   WFL  FUNCTIONAL TESTS: NT  GAIT:  Comments: Unremarkable  TODAY'S TREATMENT:                                                                                                                              DATE:   07/01/23: Arm bike: level 2 x6 minutes (3/3)-PT present to discuss progress  Cervical mechanical traction: 20#/5# x 15 minutes (60 seconds/10 seconds) Trigger Point Dry Needling  Subsequent Treatment: Instructions provided previously at initial dry needling treatment.  Instructions reviewed, if requested by the patient, prior to subsequent dry needling treatment.   Patient Verbal Consent Given: Yes Education Handout Provided: Previously Provided Muscles Treated: L UT, levator and  rhomboids Electrical Stimulation Performed: No Treatment Response/Outcome: Utilized skilled palpation to identify bony landmarks and trigger points.  Able to illicit twitch response and muscle elongation.  Soft tissue mobilization to L UT, levator and rhomboids following to further promote tissue elongation and decreased pain.    Cervical mechanical traction: 20#/5# x 15 minutes (60 seconds/10 seconds) 06/24/23: Arm bike: level 2 x6 minutes (3/3)-PT present to discuss progress  Seated on green ball: 2# weights 2x10 bil each Seated on ball: green ball horizontal abduction 2x10 Low trap lift offs facing wall x 20 Wall clocks yellow band x5 each  Cervical mechanical traction: 20#/5# x 15 minutes (60 seconds/10 seconds) 06/22/23: Arm bike: level 2 x6 minutes (3/3)-PT present to discuss progress  Low trap lift offs facing wall x 20 Wall clock taps with blue plyo l ball  x 10 (down and up = 1) B Standing green band diagonals x 10 B Trigger Point Dry Needling  Subsequent Treatment: Instructions provided previously at initial dry needling treatment.  Instructions reviewed, if requested by the patient, prior to subsequent dry needling treatment.   Patient Verbal Consent Given: Yes Education Handout Provided: Previously Provided Muscles Treated: L UT, levator and rhomboids Electrical Stimulation Performed: No Treatment Response/Outcome: Utilized skilled palpation to identify bony landmarks and trigger points.  Able to illicit twitch response and muscle elongation.  Soft tissue mobilization to L UT, levator and rhomboids following to further promote tissue elongation and decreased pain.     Cervical mechanical traction: 20#/5# x 15 minutes (60 seconds/10 seconds)  Education details: DN handout;  POC; HEP Person educated: Patient Education method: Explanation, Demonstration, and Handouts Education comprehension: verbalized understanding, returned demonstration, and needs further education  HOME  EXERCISE PROGRAM: Access Code: [email or web address] URL: [email or web address] Date: 05/21/2023 Prepared by: Raynelle Fanning  Exercises - Seated Upper Trapezius Stretch  - 1-2 x daily - 7 x weekly - 2 sets - 20-30 hold - Seated Levator Scapulae Stretch  - 1-2 x daily - 7 x weekly - 2 sets - 20-30 hold - Seated Scapular Retraction  - 1 x daily - 7 x weekly - 2 sets - 3 hold - Prone Shoulder Extension - Single Arm  - 2 x daily - 7 x weekly - 2 sets - 10 reps - Prone Shoulder Row  - 2 x daily - 7 x weekly - 2 sets - 10 reps - Prone Single Arm Shoulder Horizontal Abduction with Scapular Retraction and Palm Down  - 2 x daily - 7 x weekly - 2 sets - 10 reps - Sidelying Shoulder External Rotation  - 2 x daily - 7 x weekly - 2 sets - 10 reps - Single Arm Serratus Punches in Supine with Dumbbell  - 2 x daily - 7 x weekly - 2 sets - 10 reps - Shoulder Flexion Wall Slide with Resistance Band  - 1 x daily - 7 x weekly - 3 sets - 10 reps - Shoulder External Rotation with Anchored Resistance  - 1 x daily - 3-4 x weekly - 1 sets - 10 reps - Standing Shoulder Horizontal Abduction with Resistance  - 1 x daily - 3-4 x weekly - 1 sets - 10 reps - Supine Serratus Punches Resistance  - 1 x daily - 3-4 x weekly - 1 sets - 10 reps - Quadruped Full Range Thoracic Rotation with Reach  - 2 x daily - 7 x weekly - 2 sets - 10 reps - Seated Thoracic Extension Arms Overhead  - 2 x daily - 7 x weekly - 2 sets - 10 reps  ASSESSMENT:  CLINICAL IMPRESSION: Pt has reached a plateau with PT services. Pt reports that she continues to report neck and Lt shoulder pain.  She is independent and compliant with her HEP and is making postural corrections at home and work.  Pt does respond well to traction and PT suggested a home traction unit would be beneficial to help with symptoms.  Pt will follow up with MD to discuss continued symptoms.    OBJECTIVE IMPAIRMENTS: decreased ROM, increased muscle spasms, postural dysfunction, and pain.    ACTIVITY LIMITATIONS: sitting, sleeping, and reach over head  PARTICIPATION LIMITATIONS: cleaning, driving, community activity, and occupation  PERSONAL FACTORS: Time since onset of injury/illness/exacerbation and 1-2 comorbidities: anxiety & depression  are also affecting patient's functional outcome.   REHAB POTENTIAL: Good  CLINICAL DECISION MAKING: Stable/uncomplicated  EVALUATION COMPLEXITY: Low   GOALS: Goals reviewed with patient? Yes  SHORT TERM GOALS: Target date: 05/18/2023  Patient will be independent with initial HEP. Baseline:  Goal status: MET  2.  Patient will report > or = to 30% improvement in symptoms since starting PT. Baseline: 25% (07/01/23) Goal status: partially met  3.  Patient will report 30% improvement in sleep quality.  Baseline:  Goal status: IN PROGRESS 25% 12/9  LONG TERM GOALS: Target date: 06/15/2023 extended to 07/03/23  Patient will demonstrate independence in advanced HEP. Baseline:  Goal status: IN PROGRESS - met for current HEP 06/15/23  2.  Patient will report > or = to 70% improvement in  symptoms since starting PT. Baseline: 25%  (07/01/23) Goal status: partially met   3.  Patient will report > or = to 70% in sleep quality.  Baseline: sleep not limited by neck pain (07/01/23) Goal status: MET  4.  Patient will be able to reach over head with < or = to 2 / 10 pain. Baseline:  Goal status: MET  5.  Patient will be able to begin workout regimen  Baseline:  Goal status: IN PROGRESS      PLAN: PHYSICAL THERAPY DISCHARGE SUMMARY  Visits from Start of Care: 13  Current functional level related to goals / functional outcomes: Pt has plateaued regarding her symptoms at this time.  25% overall reduction in her pain levels.     Remaining deficits: Neck pain and Lt UE pain that remains.  Pt has HEP to address flexibility and strength.   Education / Equipment: HEP, postural education   Patient agrees to discharge. Patient  goals were partially met. Patient is being discharged due to being pleased with the current functional level.  Lorrene Reid, PT 07/01/23 4:51 PM  [facility] [address] [place], [place] [address] Phone # [phone] Fax [phone]
